# Patient Record
Sex: Male | Born: 1985
Health system: Southern US, Community
[De-identification: ages and names within clinical notes are randomized; demographics above are authoritative.]

## PROBLEM LIST (undated history)

## (undated) DIAGNOSIS — M950 Acquired deformity of nose: Secondary | ICD-10-CM

## (undated) DIAGNOSIS — J342 Deviated nasal septum: Secondary | ICD-10-CM

## (undated) HISTORY — PX: APPENDECTOMY: SHX54

---

## 2005-09-01 HISTORY — PX: NASAL FRACTURE SURGERY: SHX718

## 2007-09-02 ENCOUNTER — Emergency Department (HOSPITAL_COMMUNITY): Admission: EM | Admit: 2007-09-02 | Discharge: 2007-09-02 | Payer: Self-pay | Admitting: Emergency Medicine

## 2007-09-10 ENCOUNTER — Emergency Department (HOSPITAL_COMMUNITY): Admission: EM | Admit: 2007-09-10 | Discharge: 2007-09-10 | Payer: Self-pay | Admitting: Emergency Medicine

## 2008-04-27 ENCOUNTER — Ambulatory Visit (HOSPITAL_COMMUNITY): Admission: RE | Admit: 2008-04-27 | Discharge: 2008-04-27 | Payer: Self-pay | Admitting: Internal Medicine

## 2008-07-10 ENCOUNTER — Ambulatory Visit (HOSPITAL_COMMUNITY): Admission: RE | Admit: 2008-07-10 | Discharge: 2008-07-10 | Payer: Self-pay | Admitting: Family Medicine

## 2008-09-24 ENCOUNTER — Emergency Department (HOSPITAL_COMMUNITY): Admission: EM | Admit: 2008-09-24 | Discharge: 2008-09-24 | Payer: Self-pay | Admitting: Emergency Medicine

## 2009-05-20 ENCOUNTER — Emergency Department (HOSPITAL_COMMUNITY): Admission: EM | Admit: 2009-05-20 | Discharge: 2009-05-21 | Payer: Self-pay | Admitting: Emergency Medicine

## 2009-10-11 ENCOUNTER — Emergency Department (HOSPITAL_COMMUNITY): Admission: EM | Admit: 2009-10-11 | Discharge: 2009-10-11 | Payer: Self-pay | Admitting: Emergency Medicine

## 2011-09-22 ENCOUNTER — Emergency Department (HOSPITAL_COMMUNITY): Payer: No Typology Code available for payment source

## 2011-09-22 ENCOUNTER — Encounter (HOSPITAL_COMMUNITY): Payer: Self-pay | Admitting: *Deleted

## 2011-09-22 ENCOUNTER — Emergency Department (HOSPITAL_COMMUNITY)
Admission: EM | Admit: 2011-09-22 | Discharge: 2011-09-22 | Disposition: A | Discharge status: Home / Self Care | Payer: No Typology Code available for payment source | Attending: Emergency Medicine | Admitting: Emergency Medicine

## 2011-09-22 DIAGNOSIS — M545 Low back pain, unspecified: Secondary | ICD-10-CM | POA: Insufficient documentation

## 2011-09-22 DIAGNOSIS — M542 Cervicalgia: Secondary | ICD-10-CM | POA: Insufficient documentation

## 2011-09-22 DIAGNOSIS — R51 Headache: Secondary | ICD-10-CM | POA: Insufficient documentation

## 2011-09-22 DIAGNOSIS — T148XXA Other injury of unspecified body region, initial encounter: Secondary | ICD-10-CM

## 2011-09-22 MED ORDER — HYDROCODONE-ACETAMINOPHEN 5-325 MG PO TABS: ORAL_TABLET | ORAL | Status: AC

## 2011-09-22 MED ORDER — IBUPROFEN 400 MG PO TABS
400.0000 mg | ORAL_TABLET | Freq: Once | ORAL | Status: AC
  Administered 2011-09-22: 400 mg via ORAL
  Filled 2011-09-22: qty 1

## 2011-09-22 MED ORDER — METHOCARBAMOL 500 MG PO TABS: 1000.0000 mg | ORAL_TABLET | Freq: Four times a day (QID) | ORAL | Status: AC | PRN

## 2011-09-22 MED ORDER — OXYCODONE-ACETAMINOPHEN 5-325 MG PO TABS
1.0000 | ORAL_TABLET | Freq: Once | ORAL | Status: AC
  Administered 2011-09-22: 1 via ORAL
  Filled 2011-09-22: qty 1

## 2011-09-22 MED ORDER — NAPROXEN 250 MG PO TABS: 250.0000 mg | ORAL_TABLET | Freq: Two times a day (BID) | ORAL | Status: DC
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
## 2011-09-22 NOTE — ED Notes (Signed)
Patient returned to CT via stretcher.
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
## 2011-11-18 ENCOUNTER — Encounter (HOSPITAL_COMMUNITY): Payer: Self-pay

## 2011-11-18 ENCOUNTER — Emergency Department (HOSPITAL_COMMUNITY)
Admission: EM | Admit: 2011-11-18 | Discharge: 2011-11-18 | Disposition: A | Discharge status: Home / Self Care | Payer: Self-pay | Attending: Emergency Medicine | Admitting: Emergency Medicine

## 2011-11-18 DIAGNOSIS — J029 Acute pharyngitis, unspecified: Secondary | ICD-10-CM | POA: Insufficient documentation

## 2011-11-18 DIAGNOSIS — B349 Viral infection, unspecified: Secondary | ICD-10-CM

## 2011-11-18 DIAGNOSIS — B9789 Other viral agents as the cause of diseases classified elsewhere: Secondary | ICD-10-CM | POA: Insufficient documentation

## 2011-11-18 LAB — RAPID STREP SCREEN (MED CTR MEBANE ONLY): Streptococcus, Group A Screen (Direct): NEGATIVE

## 2011-11-18 MED ORDER — IBUPROFEN 800 MG PO TABS
800.0000 mg | ORAL_TABLET | Freq: Once | ORAL | Status: AC
  Administered 2011-11-18: 800 mg via ORAL
  Filled 2011-11-18: qty 1

## 2011-11-18 MED ORDER — HYDROCODONE-ACETAMINOPHEN 5-325 MG PO TABS: 1.0000 | ORAL_TABLET | ORAL | Status: DC | PRN

## 2011-11-18 MED ORDER — HYDROCODONE-ACETAMINOPHEN 5-325 MG PO TABS
1.0000 | ORAL_TABLET | Freq: Once | ORAL | Status: AC
  Administered 2011-11-18: 1 via ORAL
  Filled 2011-11-18: qty 1

## 2011-11-18 MED ORDER — IBUPROFEN 800 MG PO TABS: 800.0000 mg | ORAL_TABLET | Freq: Three times a day (TID) | ORAL | Status: DC

## 2011-11-18 NOTE — ED Notes (Signed)
Pt alert & oriented x4, stable gait. Pt given discharge instructions, paperwork & prescription(s). Patient instructed to stop at the registration desk to finish any additional paperwork. pt verbalized understanding. Pt left department w/ no further questions.  

## 2011-11-18 NOTE — ED Notes (Signed)
Pt states took tylenol about 1 hour ago for fever.

## 2011-11-18 NOTE — ED Notes (Signed)
Sore throat and fever since saturday

## 2011-11-18 NOTE — Discharge Instructions (Signed)
Drink lots of fluids. Use tylenol or ibuprofen for aches, pains and any fevers. Salt water gargles, lozenges, hot beverages for the sore throat.Use the stronger pain medicine as needed. Follow up with your doctor.

## 2011-11-18 NOTE — ED Provider Notes (Signed)
History     CSN: [id number]  Arrival date & time 11/18/11  0343   First MD Initiated Contact with Patient 11/18/11 [phone]      Chief Complaint  Patient presents with  . Sore Throat  . Fever    (Consider location/radiation/quality/duration/timing/severity/associated sxs/prior treatment) HPI Daniel Nunez is a 26 y.o. male who presents to the Emergency Department complaining of sore throat, body aches, chills, fever since Saturday. He has taken tylenol with no relief. Denies nausea, vomiting, chest pain, shortness of breath. He has had a dry cough.  History reviewed. No pertinent past medical history.  History reviewed. No pertinent past surgical history.  No family history on file.  History  Substance Use Topics  . Smoking status: Never Smoker   . Smokeless tobacco: Not on file  . Alcohol Use: Yes      Review of Systems  Constitutional: Positive for fever, chills and fatigue.       10 Systems reviewed and are negative for acute change except as noted in the HPI.  HENT: Negative.  Negative for congestion.        Sore throat  Eyes: Negative for discharge and redness.  Respiratory: Positive for cough. Negative for shortness of breath.   Cardiovascular: Negative for chest pain.  Gastrointestinal: Negative for vomiting and abdominal pain.  Musculoskeletal: Negative for back pain.       Body aches and pain  Skin: Negative for rash.  Neurological: Negative for syncope, numbness and headaches.  Psychiatric/Behavioral:       No behavior change.    Allergies  Penicillins  Home Medications   Current Outpatient Rx  Name Route Sig Dispense Refill  . ADULT MULTIVITAMIN W/MINERALS CH Oral Take 1 tablet by mouth daily.    Marland Kitchen NAPROXEN 250 MG PO TABS Oral Take 1 tablet (250 mg total) by mouth 2 (two) times daily with a meal. 14 tablet 0    BP 117/75  Pulse 104  Temp(Src) 100.7 F (38.2 C) (Oral)  Resp 16  Ht 6' (1.829 m)  Wt 210 lb (95.255 kg)  BMI 28.48 kg/m2  SpO2  100%  Physical Exam  Nursing note and vitals reviewed. Constitutional:       Awake, alert, nontoxic appearance.  HENT:  Head: Atraumatic.  Eyes: Right eye exhibits no discharge. Left eye exhibits no discharge.  Neck: Neck supple.  Pulmonary/Chest: Effort normal. He exhibits no tenderness.  Abdominal: Soft. There is no tenderness. There is no rebound.  Musculoskeletal: He exhibits no tenderness.       Baseline ROM, no obvious new focal weakness.  Neurological:       Mental status and motor strength appears baseline for patient and situation.  Skin: No rash noted.  Psychiatric: He has a normal mood and affect.    ED Course  Procedures (including critical care time) Results for orders placed during the hospital encounter of 11/18/11  RAPID STREP SCREEN      Component Value Range   Streptococcus, Group A Screen (Direct) NEGATIVE  NEGATIVE       MDM  Patient with flu like symptoms since Saturday. Given ibuprofen and analgesic with improvement. Pt feels improved after observation and/or treatment in ED.Pt stable in ED with no significant deterioration in condition.The patient appears reasonably screened and/or stabilized for discharge and I doubt any other medical condition or other [facility] requiring further screening, evaluation, or treatment in the ED at this time prior to discharge.  MDM Reviewed: nursing note and vitals  Interpretation: labs           Nicoletta Dress. Colon Branch, MD 11/18/11 1610

## 2011-11-23 ENCOUNTER — Encounter (HOSPITAL_COMMUNITY): Payer: Self-pay | Admitting: *Deleted

## 2011-11-23 ENCOUNTER — Emergency Department (HOSPITAL_COMMUNITY)
Admission: EM | Admit: 2011-11-23 | Discharge: 2011-11-23 | Disposition: A | Discharge status: Home / Self Care | Payer: Self-pay | Attending: Emergency Medicine | Admitting: Emergency Medicine

## 2011-11-23 DIAGNOSIS — K121 Other forms of stomatitis: Secondary | ICD-10-CM

## 2011-11-23 DIAGNOSIS — K1370 Unspecified lesions of oral mucosa: Secondary | ICD-10-CM | POA: Insufficient documentation

## 2011-11-23 DIAGNOSIS — R599 Enlarged lymph nodes, unspecified: Secondary | ICD-10-CM | POA: Insufficient documentation

## 2011-11-23 DIAGNOSIS — M542 Cervicalgia: Secondary | ICD-10-CM | POA: Insufficient documentation

## 2011-11-23 LAB — GLUCOSE, CAPILLARY: Glucose-Capillary: 88 mg/dL (ref 70–99)

## 2011-11-23 LAB — RAPID STREP SCREEN (MED CTR MEBANE ONLY): Streptococcus, Group A Screen (Direct): NEGATIVE

## 2011-11-23 MED ORDER — LIDOCAINE VISCOUS 2 % MT SOLN
20.0000 mL | Freq: Once | OROMUCOSAL | Status: AC
  Administered 2011-11-23: 15 mL via OROMUCOSAL
  Filled 2011-11-23: qty 15

## 2011-11-23 NOTE — ED Notes (Signed)
Reports sore throat x 1 week, went to dr this week but not given any antibiotics, still having sore throat and reports right side swelling to neck. Airway intact, resp e/u.

## 2011-11-23 NOTE — Discharge Instructions (Signed)
Oral Ulcers Oral ulcers are painful, shallow sores around the lining of the mouth. They can affect the gums, the inside of the lips and the cheeks (sores on the outside of the lips and on the face are different). They typically first occur in school aged children and teenagers. Oral ulcers may also be called canker sores or cold sores. CAUSES  Canker sores and cold sores can be caused by many factors including:  Infection.   Injury.   Sun exposure.   Medications.   Emotional stress.   Food allergies.   Vitamin deficiencies.   Toothpastes containing sodium lauryl sulfate.  The Herpes Virus can be the cause of mouth ulcers. The first infection can be severe and cause 10 or more ulcers on the gums, tongue and lips with fever and difficulty in swallowing. This infection usually occurs between the ages of 1 and 3 years.  SYMPTOMS  The typical sore is about  inch (6 mm) in size, is an oval or round ulcer with red borders. DIAGNOSIS  Your caregiver can diagnose simple oral ulcers by examination. Additional testing is usually not required.  TREATMENT  Treatment is aimed at pain relief. Generally, oral ulcers resolve by themselves within 1 to 2 weeks without medication and are not contagious unless caused by Herpes (and other viruses). Antibiotics are not effective with mouth sores. Avoid direct contact with others until the ulcer is completely healed. See your caregiver for follow-up care as recommended. Also:  Offer a soft diet.   Encourage plenty of fluids to prevent dehydration. Popsicles and milk shakes can be helpful.   Avoid acidic and salty foods and drinks such as orange juice.   Infants and young children will often refuse to drink because of pain. Using a teaspoon, cup or syringe to give small amounts of fluids frequently can help prevent dehydration.   Cold compresses on the face may help reduce pain.   Pain medication can help control soreness.   A solution of  diphenhydramine (children's liquid benadryl, 25mg )  mixed with a liquid antacid (maalox, half of a normal dose) can be useful to decrease the soreness of ulcers. Consult a caregiver for the dosing.   Liquids or ointments with a numbing ingredient may be helpful when used as recommended.   Older children and teenagers can rinse their mouth with a salt-water mixture (1/2 teaspoonof salt in 8 ounces of water) four times a day. This treatment is uncomfortable but may reduce the time the ulcers are present.   There are many over the counter throat lozenges and medications available for oral ulcers. There effectiveness has not been studied.   Consult your medical caregiver prior to using homeopathic treatments for oral ulcers.  SEEK MEDICAL CARE IF:   You think your child needs to be seen.   The pain worsens and you cannot control it.   There are 4 or more ulcers.   The lips and gums begin to bleed and crust.   A single mouth ulcer is near a tooth that is causing a toothache or pain.   Your child has a fever, swollen face, or swollen glands.   The ulcers began after starting a medication.   Mouth ulcers keep re-occurring or last more than 2 weeks.   You think your child is not taking adequate fluids.  SEEK IMMEDIATE MEDICAL CARE IF:   You have a high fever.   You are unable to swallow or become dehydrated.   You look or acts  very ill.   An ulcer caused by a chemical your child accidentally put in their mouth.  Document Released: 09/25/2004 Document Revised: 08/07/2011 Document Reviewed: 05/10/2009 [facility] Patient Information 2012 [employer], [place].       Antibiotic Nonuse  Your caregiver felt that the infection or problem was not one that would be helped with an antibiotic. Infections may be caused by viruses or bacteria. Only a caregiver can tell which one of these is the likely cause of an illness. A cold is the most common cause of infection in both adults and children. A  cold is a virus. Antibiotic treatment will have no effect on a viral infection. Viruses can lead to many lost days of work caring for sick children and many missed days of school. Children may catch as many as 10 "colds" or "flus" per year during which they can be tearful, cranky, and uncomfortable. The goal of treating a virus is aimed at keeping the ill person comfortable. Antibiotics are medications used to help the body fight bacterial infections. There are relatively few types of bacteria that cause infections but there are hundreds of viruses. While both viruses and bacteria cause infection they are very different types of germs. A viral infection will typically go away by itself within 7 to 10 days. Bacterial infections may spread or get worse without antibiotic treatment. Examples of bacterial infections are:  Sore throats (like strep throat or tonsillitis).   Infection in the lung (pneumonia).   Ear and skin infections.  Examples of viral infections are:  Colds or flus.   Most coughs and bronchitis.   Sore throats not caused by Strep.   Runny noses.  It is often best not to take an antibiotic when a viral infection is the cause of the problem. Antibiotics can kill off the helpful bacteria that we have inside our body and allow harmful bacteria to start growing. Antibiotics can cause side effects such as allergies, nausea, and diarrhea without helping to improve the symptoms of the viral infection. Additionally, repeated uses of antibiotics can cause bacteria inside of our body to become resistant. That resistance can be passed onto harmful bacterial. The next time you have an infection it may be harder to treat if antibiotics are used when they are not needed. Not treating with antibiotics allows our own immune system to develop and take care of infections more efficiently. Also, antibiotics will work better for [place] when they are prescribed for bacterial infections. Treatments for a child  that is ill may include:  Give extra fluids throughout the day to stay hydrated.   Get plenty of rest.   Only give your child over-the-counter or prescription medicines for pain, discomfort, or fever as directed by your caregiver.   The use of a cool mist humidifier may help stuffy noses.   Cold medications if suggested by your caregiver.  Your caregiver may decide to start you on an antibiotic if:  The problem you were seen for today continues for a longer length of time than expected.   You develop a secondary bacterial infection.  SEEK MEDICAL CARE IF:  Fever lasts longer than 5 days.   Symptoms continue to get worse after 5 to 7 days or become severe.   Difficulty in breathing develops.   Signs of dehydration develop (poor drinking, rare urinating, dark colored urine).   Changes in behavior or worsening tiredness (listlessness or lethargy).  Document Released: 10/27/2001 Document Revised: 08/07/2011 Document Reviewed: 04/25/2009 [employer] Patient Information  [address] [place].

## 2011-11-23 NOTE — ED Provider Notes (Signed)
History     CSN: [id number]  Arrival date & time 11/23/11  1235   First MD Initiated Contact with Patient 11/23/11 1255      Chief Complaint  Patient presents with  . Sore Throat    (Consider location/radiation/quality/duration/timing/severity/associated sxs/prior treatment) The history is provided by the patient.  Pt is a healthy 26 y/o M who presents to the ED via private vehicle with c/c of sore throat x 1 week. Although pain initially in the entire throat, it now involves only the right side, is mild, and is associated with swelling of the right side of the neck. When the sore throat began, he had associated viral symptoms (fever, chills, congestion, myalgias), though these resolved several days ago. He was seen in the [facility] ED for his symptoms 5 days ago and had a negative strep screen with rx for ibuprofen and hydrocodone-acetaminophen. Swallowing makes his pain worse. Pain medications help temporarily.   History reviewed. No pertinent past medical history.  History reviewed. No pertinent past surgical history.  History reviewed. No pertinent family history.  History  Substance Use Topics  . Smoking status: Never Smoker   . Smokeless tobacco: Not on file  . Alcohol Use: Yes      Review of Systems  Constitutional: Negative for fever and chills.  HENT: Positive for sore throat and neck pain. Negative for ear pain, congestion, facial swelling and neck stiffness.   Eyes: Negative for pain.  Respiratory: Negative for cough and shortness of breath.   Gastrointestinal: Negative for nausea and vomiting.  Skin: Negative for rash.  Neurological: Negative for headaches.    Allergies  Penicillins  Home Medications   Current Outpatient Rx  Name Route Sig Dispense Refill  . ACETAMINOPHEN 500 MG PO TABS Oral Take 1,000 mg by mouth every 6 (six) hours as needed. For pain.    Marland Kitchen HYDROCODONE-ACETAMINOPHEN 5-325 MG PO TABS Oral Take 1 tablet by mouth every 4 (four) hours as  needed. For pain.    . IBUPROFEN 800 MG PO TABS Oral Take 800 mg by mouth 3 (three) times daily as needed. For pain.      BP 141/77  Pulse 93  Temp(Src) 98.9 F (37.2 C) (Oral)  Resp 18  SpO2 100%  Physical Exam  Nursing note and vitals reviewed. Constitutional: He is oriented to person, place, and time. He appears well-developed and well-nourished. No distress.  HENT:  Head: Normocephalic and atraumatic. No trismus in the jaw.  Right Ear: Hearing, tympanic membrane, external ear and ear canal normal.  Left Ear: Hearing, tympanic membrane, external ear and ear canal normal.  Nose: Nose normal.  Mouth/Throat: Mucous membranes are normal. Oral lesions present. No uvula swelling. Posterior oropharyngeal erythema present. No oropharyngeal exudate, posterior oropharyngeal edema or tonsillar abscesses.    Neck: Normal range of motion. Neck supple. No tracheal deviation present.       Right tonsillar LAD  Cardiovascular: Normal rate, regular rhythm and normal heart sounds.   Pulmonary/Chest: Effort normal and breath sounds normal. No stridor. No respiratory distress. He has no wheezes. He exhibits no tenderness.  Musculoskeletal: He exhibits no edema and no tenderness.  Neurological: He is alert and oriented to person, place, and time. No cranial nerve deficit.  Skin: Skin is warm and dry. No rash noted.  Psychiatric: He has a normal mood and affect.    ED Course  Procedures (including critical care time)   Labs Reviewed  RAPID STREP SCREEN  GLUCOSE, CAPILLARY  No results found.   1. Oral ulceration       MDM  Healthy male, non-toxic appearing with 1 week symptoms. On today's examination two ulcers are visualized in the oral cavity, most consistent with apthous ulcers vs herpangina. No indication on prior visit chart of ulcers on exam. Discussed symptomatic treatment measures with patient and family. Will give oral viscous lidocaine for comfort.      2:04 PM Pt with  some pain relief with oral lidocaine, but refuses a prescription for home use as he feels his discomfort is only mild.  Shaaron Adler, [place] 11/23/11 [phone]

## 2011-11-25 NOTE — ED Provider Notes (Signed)
Medical screening examination/treatment/procedure(s) were performed by non-physician practitioner and as supervising physician I was immediately available for consultation/collaboration.   Cyndra Numbers, MD 11/25/11 [phone]

## 2012-08-19 ENCOUNTER — Emergency Department (HOSPITAL_COMMUNITY)
Admission: EM | Admit: 2012-08-19 | Discharge: 2012-08-19 | Disposition: A | Discharge status: Home / Self Care | Payer: Self-pay | Attending: Emergency Medicine | Admitting: Emergency Medicine

## 2012-08-19 ENCOUNTER — Emergency Department (HOSPITAL_COMMUNITY): Payer: Self-pay

## 2012-08-19 ENCOUNTER — Encounter (HOSPITAL_COMMUNITY): Payer: Self-pay | Admitting: *Deleted

## 2012-08-19 DIAGNOSIS — R059 Cough, unspecified: Secondary | ICD-10-CM | POA: Insufficient documentation

## 2012-08-19 DIAGNOSIS — R0602 Shortness of breath: Secondary | ICD-10-CM | POA: Insufficient documentation

## 2012-08-19 DIAGNOSIS — B349 Viral infection, unspecified: Secondary | ICD-10-CM

## 2012-08-19 DIAGNOSIS — J40 Bronchitis, not specified as acute or chronic: Secondary | ICD-10-CM | POA: Insufficient documentation

## 2012-08-19 DIAGNOSIS — IMO0001 Reserved for inherently not codable concepts without codable children: Secondary | ICD-10-CM | POA: Insufficient documentation

## 2012-08-19 DIAGNOSIS — J029 Acute pharyngitis, unspecified: Secondary | ICD-10-CM | POA: Insufficient documentation

## 2012-08-19 DIAGNOSIS — B9789 Other viral agents as the cause of diseases classified elsewhere: Secondary | ICD-10-CM | POA: Insufficient documentation

## 2012-08-19 DIAGNOSIS — R05 Cough: Secondary | ICD-10-CM | POA: Insufficient documentation

## 2012-08-19 MED ORDER — AZITHROMYCIN 250 MG PO TABS: 250.0000 mg | ORAL_TABLET | Freq: Every day | ORAL | Status: DC

## 2012-08-19 MED ORDER — PROMETHAZINE-CODEINE 6.25-10 MG/5ML PO SYRP: 5.0000 mL | ORAL_SOLUTION | ORAL | Status: DC | PRN

## 2012-08-19 MED ORDER — ACETAMINOPHEN 500 MG PO TABS
ORAL_TABLET | ORAL | Status: AC
  Administered 2012-08-19: 1000 mg
  Filled 2012-08-19: qty 2

## 2012-08-19 MED ORDER — AZITHROMYCIN 250 MG PO TABS
500.0000 mg | ORAL_TABLET | Freq: Once | ORAL | Status: AC
  Administered 2012-08-19: 500 mg via ORAL
  Filled 2012-08-19: qty 2

## 2012-08-19 NOTE — ED Notes (Signed)
Fever, cough, headache, chest hurts with cough.  Yellow-clear sputum.

## 2012-08-19 NOTE — ED Provider Notes (Signed)
History     CSN: [id number]  Arrival date & time 08/19/12  4098   First MD Initiated Contact with Patient 08/19/12 1852      Chief Complaint  Patient presents with  . Fever    (Consider location/radiation/quality/duration/timing/severity/associated sxs/prior treatment) HPI Comments: Daniel Nunez presents with a return of fever since last night up to 102.  He describes developing fever, chills, muscle aches,  non productive cough and sore throat starting 4 days ago.  He has slept a lot and has been taking dayquil and nyquil for his symptoms and was actually feeling better until last night when his fever spiked again and has been having a more productive cough with yellow to green sputum along with upper sternal burning pain with cough.  He has felt short of breath.  He denies nausea, vomiting and abdominal pain.  The history is provided by the patient.    History reviewed. No pertinent past medical history.  Past Surgical History  Procedure Date  . Fx nose surgery     History reviewed. No pertinent family history.  History  Substance Use Topics  . Smoking status: Never Smoker   . Smokeless tobacco: Not on file  . Alcohol Use: Yes      Review of Systems  Constitutional: Positive for fever and chills.  HENT: Negative for congestion, sore throat, rhinorrhea and neck pain.   Eyes: Negative.   Respiratory: Positive for cough and shortness of breath. Negative for chest tightness and wheezing.   Cardiovascular: Negative for chest pain.  Gastrointestinal: Negative for nausea and abdominal pain.  Genitourinary: Negative.   Musculoskeletal: Positive for myalgias. Negative for joint swelling and arthralgias.  Skin: Negative.  Negative for rash and wound.  Neurological: Negative for dizziness, weakness, light-headedness, numbness and headaches.  Hematological: Negative.   Psychiatric/Behavioral: Negative.     Allergies  Penicillins  Home Medications   Current  Outpatient Rx  Name  Route  Sig  Dispense  Refill  . ACETAMINOPHEN 500 MG PO TABS   Oral   Take 1,000 mg by mouth every 6 (six) hours as needed. For pain.         Marland Kitchen VICKS DAYQUIL/NYQUIL CLD & FLU PO   Oral   Take 2 capsules by mouth daily as needed. For cold and flu symptoms         . AZITHROMYCIN 250 MG PO TABS   Oral   Take 1 tablet (250 mg total) by mouth daily.   4 tablet   0   . PROMETHAZINE-CODEINE 6.25-10 MG/5ML PO SYRP   Oral   Take 5 mLs by mouth every 4 (four) hours as needed for cough.   120 mL   0     BP 138/80  Pulse 92  Temp 101.3 F (38.5 C) (Oral)  Resp 20  Ht 6' (1.829 m)  Wt 204 lb (92.534 kg)  BMI 27.67 kg/m2  SpO2 100%  Physical Exam  Nursing note and vitals reviewed. Constitutional: He appears well-developed and well-nourished.  HENT:  Head: Normocephalic and atraumatic.  Eyes: Conjunctivae normal are normal.  Neck: Normal range of motion.  Cardiovascular: Normal rate, regular rhythm, normal heart sounds and intact distal pulses.   Pulmonary/Chest: Effort normal. He has no wheezes. He has rhonchi in the right lower field. He exhibits no tenderness.       Occasional crackle right base with decreased breath sounds.  Abdominal: Soft. Bowel sounds are normal. There is no tenderness.  Musculoskeletal: Normal range  of motion.  Neurological: He is alert.  Skin: Skin is warm and dry.  Psychiatric: He has a normal mood and affect.    ED Course  Procedures (including critical care time)  Labs Reviewed - No data to display Dg Chest 2 View  08/19/2012  *RADIOLOGY REPORT*  Clinical Data: Fever and cough  CHEST - 2 VIEW  Comparison: Chest radiograph 09/22/2011  Findings: The heart, mediastinal, and hilar contours are normal. The lungs are normally expanded and clear.  The trachea is midline. Negative for pleural effusion or pneumothorax.  The bones are unremarkable.  IMPRESSION: No acute cardiopulmonary disease.   Original Report Authenticated By:  Britta Mccreedy, M.D.      1. Viral syndrome   2. Bronchitis       MDM  X-ray reviewed and discussed with patient.  However, given patient's symptoms and exam findings, and suspicious for possible early infiltrate that just is not showing yet on plain film.  He was prescribed Zithromax, first dose given here.  Also encouraged rest, increase fluids, prescribed Phenergan With Codeine for cough, encouraged Motrin for fever reduction.  Patient will get rechecked for any worsened symptoms such as persistent or increasing fevers, shortness of breath or increased weakness.        Burgess Amor, [place] 08/19/12 2114

## 2012-08-19 NOTE — ED Provider Notes (Signed)
Medical screening examination/treatment/procedure(s) were performed by non-physician practitioner and as supervising physician I was immediately available for consultation/collaboration.  Jones Skene, M.D.     Jones Skene, MD 08/19/12 2236

## 2013-01-30 DIAGNOSIS — J342 Deviated nasal septum: Secondary | ICD-10-CM

## 2013-01-30 DIAGNOSIS — M950 Acquired deformity of nose: Secondary | ICD-10-CM

## 2013-01-30 HISTORY — DX: Acquired deformity of nose: M95.0

## 2013-01-30 HISTORY — DX: Deviated nasal septum: J34.2

## 2013-02-01 ENCOUNTER — Encounter (HOSPITAL_BASED_OUTPATIENT_CLINIC_OR_DEPARTMENT_OTHER): Payer: Self-pay | Admitting: *Deleted

## 2013-02-07 ENCOUNTER — Encounter (HOSPITAL_BASED_OUTPATIENT_CLINIC_OR_DEPARTMENT_OTHER): Payer: Self-pay | Admitting: Anesthesiology

## 2013-02-07 ENCOUNTER — Encounter (HOSPITAL_BASED_OUTPATIENT_CLINIC_OR_DEPARTMENT_OTHER)
Admission: RE | Disposition: A | Discharge status: Home / Self Care | Payer: Self-pay | Source: Ambulatory Visit | Attending: Specialist

## 2013-02-07 ENCOUNTER — Ambulatory Visit (HOSPITAL_BASED_OUTPATIENT_CLINIC_OR_DEPARTMENT_OTHER)
Admission: RE | Admit: 2013-02-07 | Discharge: 2013-02-07 | Disposition: A | Discharge status: Home / Self Care | Payer: BC Managed Care – PPO | Source: Ambulatory Visit | Attending: Specialist | Admitting: Specialist

## 2013-02-07 ENCOUNTER — Ambulatory Visit (HOSPITAL_BASED_OUTPATIENT_CLINIC_OR_DEPARTMENT_OTHER): Payer: BC Managed Care – PPO | Admitting: Anesthesiology

## 2013-02-07 DIAGNOSIS — X58XXXS Exposure to other specified factors, sequela: Secondary | ICD-10-CM | POA: Insufficient documentation

## 2013-02-07 DIAGNOSIS — J342 Deviated nasal septum: Secondary | ICD-10-CM | POA: Insufficient documentation

## 2013-02-07 DIAGNOSIS — Z9889 Other specified postprocedural states: Secondary | ICD-10-CM | POA: Insufficient documentation

## 2013-02-07 DIAGNOSIS — S0291XS Unspecified fracture of skull, sequela: Secondary | ICD-10-CM | POA: Insufficient documentation

## 2013-02-07 DIAGNOSIS — M950 Acquired deformity of nose: Secondary | ICD-10-CM | POA: Insufficient documentation

## 2013-02-07 DIAGNOSIS — J343 Hypertrophy of nasal turbinates: Secondary | ICD-10-CM | POA: Insufficient documentation

## 2013-02-07 DIAGNOSIS — Z880 Allergy status to penicillin: Secondary | ICD-10-CM | POA: Insufficient documentation

## 2013-02-07 HISTORY — DX: Acquired deformity of nose: M95.0

## 2013-02-07 HISTORY — PX: NASAL RECONSTRUCTION: SHX2069

## 2013-02-07 HISTORY — PX: NASAL SEPTUM SURGERY: SHX37

## 2013-02-07 HISTORY — DX: Deviated nasal septum: J34.2

## 2013-02-07 SURGERY — RECONSTRUCTION, NOSE
Anesthesia: General | Site: Nose | Wound class: Clean Contaminated

## 2013-02-07 MED ORDER — ONDANSETRON HCL 4 MG/2ML IJ SOLN: 4.0000 mg | Freq: Once | INTRAMUSCULAR | Status: DC | PRN

## 2013-02-07 MED ORDER — LIDOCAINE-EPINEPHRINE 0.5 %-1:200000 IJ SOLN
INTRAMUSCULAR | Status: DC | PRN
  Administered 2013-02-07: 30 mL

## 2013-02-07 MED ORDER — PROPOFOL 10 MG/ML IV BOLUS
INTRAVENOUS | Status: DC | PRN
  Administered 2013-02-07: 300 mg via INTRAVENOUS

## 2013-02-07 MED ORDER — OXYCODONE HCL 5 MG/5ML PO SOLN: 5.0000 mg | Freq: Once | ORAL | Status: AC | PRN

## 2013-02-07 MED ORDER — BSS IO SOLN
INTRAOCULAR | Status: DC | PRN
  Administered 2013-02-07: 1 via INTRAOCULAR

## 2013-02-07 MED ORDER — FENTANYL CITRATE 0.05 MG/ML IJ SOLN
INTRAMUSCULAR | Status: DC | PRN
  Administered 2013-02-07: 100 ug via INTRAVENOUS

## 2013-02-07 MED ORDER — MIDAZOLAM HCL 2 MG/2ML IJ SOLN: 1.0000 mg | INTRAMUSCULAR | Status: DC | PRN

## 2013-02-07 MED ORDER — SUCCINYLCHOLINE CHLORIDE 20 MG/ML IJ SOLN
INTRAMUSCULAR | Status: DC | PRN
  Administered 2013-02-07: 100 mg via INTRAVENOUS

## 2013-02-07 MED ORDER — OXYCODONE HCL 5 MG PO TABS
5.0000 mg | ORAL_TABLET | Freq: Once | ORAL | Status: AC | PRN
  Administered 2013-02-07: 5 mg via ORAL

## 2013-02-07 MED ORDER — BSS PLUS IO SOLN: INTRAOCULAR | Status: DC | PRN

## 2013-02-07 MED ORDER — DEXAMETHASONE SODIUM PHOSPHATE 4 MG/ML IJ SOLN
INTRAMUSCULAR | Status: DC | PRN
  Administered 2013-02-07: 10 mg via INTRAVENOUS

## 2013-02-07 MED ORDER — COCAINE HCL 4 % EX SOLN
CUTANEOUS | Status: DC | PRN
  Administered 2013-02-07: 4 mL via NASAL

## 2013-02-07 MED ORDER — MIDAZOLAM HCL 5 MG/5ML IJ SOLN
INTRAMUSCULAR | Status: DC | PRN
  Administered 2013-02-07: 2 mg via INTRAVENOUS

## 2013-02-07 MED ORDER — CIPROFLOXACIN IN D5W 400 MG/200ML IV SOLN
400.0000 mg | INTRAVENOUS | Status: AC
  Administered 2013-02-07: 400 mg via INTRAVENOUS

## 2013-02-07 MED ORDER — LACTATED RINGERS IV SOLN
INTRAVENOUS | Status: DC
  Administered 2013-02-07 (×2): via INTRAVENOUS

## 2013-02-07 MED ORDER — LIDOCAINE HCL (CARDIAC) 20 MG/ML IV SOLN
INTRAVENOUS | Status: DC | PRN
  Administered 2013-02-07: 80 mg via INTRAVENOUS

## 2013-02-07 MED ORDER — HYDROMORPHONE HCL PF 1 MG/ML IJ SOLN
0.2500 mg | INTRAMUSCULAR | Status: DC | PRN
  Administered 2013-02-07 (×4): 0.5 mg via INTRAVENOUS

## 2013-02-07 MED ORDER — ONDANSETRON HCL 4 MG/2ML IJ SOLN
INTRAMUSCULAR | Status: DC | PRN
  Administered 2013-02-07: 4 mg via INTRAVENOUS

## 2013-02-07 MED ORDER — FENTANYL CITRATE 0.05 MG/ML IJ SOLN: 50.0000 ug | INTRAMUSCULAR | Status: DC | PRN

## 2013-02-07 SURGICAL SUPPLY — 45 items
APPLICATOR COTTON TIP 6IN STRL (MISCELLANEOUS) ×2 IMPLANT
BANDAGE ELASTIC 4 VELCRO ST LF (GAUZE/BANDAGES/DRESSINGS) ×2 IMPLANT
BANDAGE GAUZE ELAST BULKY 4 IN (GAUZE/BANDAGES/DRESSINGS) ×2 IMPLANT
BENZOIN TINCTURE PRP APPL 2/3 (GAUZE/BANDAGES/DRESSINGS) ×2 IMPLANT
BLADE KNIFE PERSONA 15 (BLADE) IMPLANT
CANISTER SUCTION 1200CC (MISCELLANEOUS) ×2 IMPLANT
CLOTH BEACON ORANGE TIMEOUT ST (SAFETY) ×2 IMPLANT
COAGULATOR SUCT 8FR VV (MISCELLANEOUS) ×2 IMPLANT
DECANTER SPIKE VIAL GLASS SM (MISCELLANEOUS) ×2 IMPLANT
ELECT REM PT RETURN 9FT ADLT (ELECTROSURGICAL)
ELECTRODE REM PT RTRN 9FT ADLT (ELECTROSURGICAL) IMPLANT
GAUZE PACKING 1/2 X5 YD (GAUZE/BANDAGES/DRESSINGS) ×2 IMPLANT
GAUZE SPONGE 4X4 16PLY XRAY LF (GAUZE/BANDAGES/DRESSINGS) ×2 IMPLANT
GAUZE VASELINE FOILPK 1/2 X 72 (GAUZE/BANDAGES/DRESSINGS) ×2 IMPLANT
GLOVE BIO SURGEON STRL SZ 6.5 (GLOVE) ×2 IMPLANT
GLOVE BIOGEL M STRL SZ7.5 (GLOVE) ×2 IMPLANT
GLOVE BIOGEL PI IND STRL 8 (GLOVE) ×1 IMPLANT
GLOVE BIOGEL PI INDICATOR 8 (GLOVE) ×1
GLOVE ECLIPSE 7.0 STRL STRAW (GLOVE) ×2 IMPLANT
GOWN PREVENTION PLUS XLARGE (GOWN DISPOSABLE) IMPLANT
GOWN PREVENTION PLUS XXLARGE (GOWN DISPOSABLE) ×2 IMPLANT
MARKER SKIN DUAL TIP RULER LAB (MISCELLANEOUS) ×2 IMPLANT
NEEDLE HYPO 25X1 1.5 SAFETY (NEEDLE) ×2 IMPLANT
PACK BASIN DAY SURGERY FS (CUSTOM PROCEDURE TRAY) ×2 IMPLANT
PACK ENT DAY SURGERY (CUSTOM PROCEDURE TRAY) ×2 IMPLANT
PAD EYE OVAL STERILE LF (GAUZE/BANDAGES/DRESSINGS) ×2 IMPLANT
PATTIES SURGICAL .5 X3 (DISPOSABLE) ×2 IMPLANT
SLEEVE SCD COMPRESS KNEE MED (MISCELLANEOUS) ×2 IMPLANT
SPLINT ELECTRIC BLUE LARGE (MISCELLANEOUS) ×2 IMPLANT
SPLINT ELECTRIC BLUE SMALL (MISCELLANEOUS) IMPLANT
SPLINT HOT PINK LARGE (MISCELLANEOUS) IMPLANT
SPLINT HOT PINK SMALL (MISCELLANEOUS) IMPLANT
SPLINT IVORY SMALL (MISCELLANEOUS) IMPLANT
SPLINT NASAL THERMO PLAST (MISCELLANEOUS) IMPLANT
SPLINT THERMO PLAST IVORY LRG (MISCELLANEOUS) IMPLANT
SPONGE GAUZE 4X4 12PLY (GAUZE/BANDAGES/DRESSINGS) ×2 IMPLANT
SPONGE LAP 4X18 X RAY DECT (DISPOSABLE) ×2 IMPLANT
SPONGE NEURO XRAY DETECT 1X3 (DISPOSABLE) ×2 IMPLANT
STRIP SUTURE WOUND CLOSURE 1/2 (SUTURE) ×4 IMPLANT
SUT MNCRL AB 3-0 PS2 18 (SUTURE) ×10 IMPLANT
SUT MON AB 2-0 CT1 36 (SUTURE) ×2 IMPLANT
SUT PROLENE 3 0 PS 2 (SUTURE) ×2 IMPLANT
SUT PROLENE 6 0 P 1 18 (SUTURE) IMPLANT
TOWEL OR 17X24 6PK STRL BLUE (TOWEL DISPOSABLE) ×4 IMPLANT
YANKAUER SUCT BULB TIP NO VENT (SUCTIONS) ×2 IMPLANT

## 2013-02-07 NOTE — Brief Op Note (Signed)
02/07/2013  12:42 PM  PATIENT:  Margaree Mackintosh  27 y.o. male  PRE-OPERATIVE DIAGNOSIS:  deformity of nose deviated nasal septum   POST-OPERATIVE DIAGNOSIS:  * No post-op diagnosis entered *  PROCEDURE:  Procedure(s): TOTAL NASAL RECONSTRUCTION  (N/A) WITH SEPTOPLASTY (N/A)  SURGEON:  Surgeon(s) and Role:    * Louisa Second, MD - Primary  PHYSICIAN ASSISTANT:   ASSISTANTS: none   ANESTHESIA:   general  EBL:  Total I/O In: 1000 [I.V.:1000] Out: -   BLOOD ADMINISTERED:none  DRAINS: none   LOCAL MEDICATIONS USED:  XYLOCAINE   SPECIMEN:  Excision  DISPOSITION OF SPECIMEN:  PATHOLOGY  COUNTS:  YES  TOURNIQUET:  * No tourniquets in log *  DICTATION: .Other Dictation: Dictation Number  [id number]  PLAN OF CARE: Discharge to home after PACU  PATIENT DISPOSITION:  PACU - hemodynamically stable.   Delay start of Pharmacological VTE agent (>24hrs) due to surgical blood loss or risk of bleeding: yes

## 2013-02-07 NOTE — Anesthesia Postprocedure Evaluation (Signed)
  Anesthesia Post-op Note  Patient: Daniel Nunez  Procedure(s) Performed: Procedure(s): TOTAL NASAL RECONSTRUCTION  (N/A) WITH SEPTOPLASTY (N/A)  Patient Location: PACU  Anesthesia Type:General  Level of Consciousness: awake, alert  and oriented  Airway and Oxygen Therapy: Patient Spontanous Breathing and Patient connected to face mask oxygen  Post-op Pain: mild  Post-op Assessment: Post-op Vital signs reviewed  Post-op Vital Signs: Reviewed  Complications: No apparent anesthesia complications

## 2013-02-07 NOTE — Transfer of Care (Signed)
Immediate Anesthesia Transfer of Care Note  Patient: Daniel Nunez  Procedure(s) Performed: Procedure(s): TOTAL NASAL RECONSTRUCTION  (N/A) WITH SEPTOPLASTY (N/A)  Patient Location: PACU  Anesthesia Type:General  Level of Consciousness: awake, alert , oriented and patient cooperative  Airway & Oxygen Therapy: Patient Spontanous Breathing and Patient connected to face mask oxygen  Post-op Assessment: Report given to PACU RN and Post -op Vital signs reviewed and stable  Post vital signs: Reviewed and stable  Complications: No apparent anesthesia complications

## 2013-02-07 NOTE — Anesthesia Preprocedure Evaluation (Signed)

## 2013-02-07 NOTE — Anesthesia Procedure Notes (Signed)
Procedure Name: Intubation Performed by: Eliah Ozawa W Pre-anesthesia Checklist: Patient identified, Timeout performed, Emergency Drugs available, Suction available and Patient being monitored Patient Re-evaluated:Patient Re-evaluated prior to inductionOxygen Delivery Method: Circle system utilized Preoxygenation: Pre-oxygenation with 100% oxygen Intubation Type: IV induction Ventilation: Mask ventilation without difficulty Laryngoscope Size: Miller and 2 Grade View: Grade II Tube type: Oral Tube size: 7.0 mm Number of attempts: 1 Airway Equipment and Method: Stylet Placement Confirmation: ETT inserted through vocal cords under direct vision,  positive ETCO2 and breath sounds checked- equal and bilateral Secured at: 22 cm Tube secured with: Tape Dental Injury: Teeth and Oropharynx as per pre-operative assessment      
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
## 2013-02-08 ENCOUNTER — Encounter (HOSPITAL_BASED_OUTPATIENT_CLINIC_OR_DEPARTMENT_OTHER): Payer: Self-pay | Admitting: Specialist

## 2013-02-08 LAB — POCT HEMOGLOBIN-HEMACUE: Hemoglobin: 14.7 g/dL (ref 13.0–17.0)

## 2013-02-08 NOTE — Op Note (Deleted)
NAME:  Daniel Nunez, Daniel Nunez              ACCOUNT NO.:  [id number]  MEDICAL RECORD NO.:  [id number]  LOCATION:  [id number]                        FACILITY:  [facility]  PHYSICIAN:  Elvia Aydin L. Angle Karel, M.D.DATE OF BIRTH:  12/03/1985  DATE OF PROCEDURE:  02/07/2013 DATE OF DISCHARGE:  08/19/2012                              OPERATIVE REPORT   This is a 27-year-old gentleman who has completely flatten dorsal nose, saddle nose deformity, secondary to previous trauma in the past.  He had open reduction, internal fixation done several years ago in the [facility], but evidently the areas have not healed up and he had a total collapse of the area with complete obstruction of airway breathing bilaterally as well as hypertrophy of the right and left inferior turbinates.  All the procedures in detail were explained to the patient preoperatively for the reconstruction using a fascia cartilage graft from the right postauricular area and the right groin to reconstruct the areas.  The donor sites are going to be closed primarily and then reduction of the right and left inferior turbinates with septoplasty. Preoperatively, the patient was sat up and drawn for the midline of the nose as well as demarcation of the upper and lower lateral cartilage areas, as well as the totally flatten dorsum of his nose with severe saddle nose deformity.  The patient demonstrates no bony pre- dimensionality involving his nasal bone secondary to the blow.  He was taken to the operating room, placed on the operating room table in the supine position, was given adequate general anesthesia, intubated orally.  Prep was done to the facial, neck, and groin areas with Betadine solution, walled off with sterile towels and drapes so as to make a sterile field.  A 0.5% Xylocaine with epinephrine was injected locally intranasally as well as to right postauricular area and right groin areas, total of 35 mL used 1-200,000 concentration.  This  was allowed to sit, also 4% cocaine packs were placed.  Total of 4 mL in narrowing.  His nasal airway is for vasoconstriction.  We drew a pattern of the deformity of the nose as saddle nose deformity and superimposed it over to the hard piece of sterile paper.  Right postauricular incision was made in the crease line and carried down through the skin and subcutaneous tissue with a 15 blade to underlying cartilage.  Flap was dissected and then I was able to isolate the posterior cartilage of the conchal region of the ear using the curved and direct scissors.  I was able to remove the large piece, placed in a saline.  The defect then re-closed with a running suture of 4-0 Prolene.  Next, a right groin was used to do a large elliptical excision of skin.  The skin was de- epithelialized over the mid dermis fat and fascia in 1 area.  This was taken in 1 block and placed in saline.  After proper hemostasis, the defect was then re-closed with 2-0 Monocryl x2 layers with subdermal suture of 3-0 Monocryl in a running subcuticular stitch of 3-0 Monocryl. Next, intercartilaginous incisions were carried over the dorsal septum. Mucoperichondrium was dissected bilaterally reveal in the form of septum and depressed.  The   deformed curved areas were excised out using the swivel knife and the forceps were used to remove bony prominences. Next, the dorsal angle scissors were used to elevate over the dorsal nose for space.  I was able to take to fascia and __________ and then I took pieces of the cartilage of the ear into speckles __________ approximately 20 pieces and I placed them within the fascia to make a fascia cartilage __________.  I was able to place this and mold it, suture it together using an 3-0 Monocryl, then placed into the dorsal defect to improve this __________ approximately 95%.  With this, I was able to transfix and stitch to hold it on each side and then __________ using the 3-0 nylon.   Next, the mucosa was then re-closed.  Transfixion stitch of 3-0 Monocryl placed.  Mucosa was then closed with 3-0 Monocryl, and this was after we had done the right and left lateral osteotomies to improve the nasal contour and elevate as much as we could.  This fit nicely with good contour on the profile and I was able to then pack the wounds with Vaseline gauze.  I was able to then put dorsal splint dressings on.  He tolerated all the procedures very well.  Proper dressings were placed.  He was taken to recovery in excellent condition.     Sherlonda Flater L. Mikka Kissner, M.D.     GLT/MEDQ  D:  02/07/2013  T:  02/08/2013  Job:  [id number] 

## 2013-02-08 NOTE — Op Note (Cosign Needed)
NAME:  Daniel Nunez, Daniel Nunez              ACCOUNT NO.:  [id number]  MEDICAL RECORD NO.:  [id number]  LOCATION:  [id number]                        FACILITY:  [facility]  PHYSICIAN:  Talley Kreiser L. Panagiota Perfetti, M.D.DATE OF BIRTH:  02/02/1986  DATE OF PROCEDURE:  02/07/2013 DATE OF DISCHARGE:  08/19/2012                              OPERATIVE REPORT   This is a 27-year-old gentleman who has completely flatten dorsal nose, saddle nose deformity, secondary to previous trauma in the past.  He had open reduction, internal fixation done several years ago in the [facility], but evidently the areas have not healed up and he had a total collapse of the area with complete obstruction of airway breathing bilaterally as well as hypertrophy of the right and left inferior turbinates.  All the procedures in detail were explained to the patient preoperatively for the reconstruction using a fascia cartilage graft from the right postauricular area and the right groin to reconstruct the areas.  The donor sites are going to be closed primarily and then reduction of the right and left inferior turbinates with septoplasty. Preoperatively, the patient was sat up and drawn for the midline of the nose as well as demarcation of the upper and lower lateral cartilage areas, as well as the totally flatten dorsum of his nose with severe saddle nose deformity.  The patient demonstrates no bony pre- dimensionality involving his nasal bone secondary to the blow.  He was taken to the operating room, placed on the operating room table in the supine position, was given adequate general anesthesia, intubated orally.  Prep was done to the facial, neck, and groin areas with Betadine solution, walled off with sterile towels and drapes so as to make a sterile field.  A 0.5% Xylocaine with epinephrine was injected locally intranasally as well as to right postauricular area and right groin areas, total of 35 mL used 1-200,000 concentration.  This  was allowed to sit, also 4% cocaine packs were placed.  Total of 4 mL in narrowing.  His nasal airway is for vasoconstriction.  We drew a pattern of the deformity of the nose as saddle nose deformity and superimposed it over to the hard piece of sterile paper.  Right postauricular incision was made in the crease line and carried down through the skin and subcutaneous tissue with a 15 blade to underlying cartilage.  Flap was dissected and then I was able to isolate the posterior cartilage of the conchal region of the ear using the curved and direct scissors.  I was able to remove the large piece, placed in a saline.  The defect then re-closed with a running suture of 4-0 Prolene.  Next, a right groin was used to do a large elliptical excision of skin.  The skin was de- epithelialized over the mid dermis fat and fascia in 1 area.  This was taken in 1 block and placed in saline.  After proper hemostasis, the defect was then re-closed with 2-0 Monocryl x2 layers with subdermal suture of 3-0 Monocryl in a running subcuticular stitch of 3-0 Monocryl. Next, intercartilaginous incisions were carried over the dorsal septum. Mucoperichondrium was dissected bilaterally reveal in the form of septum and depressed.  The   deformed curved areas were excised out using the swivel knife and the forceps were used to remove bony prominences. Next, the dorsal angle scissors were used to elevate over the dorsal nose for space.  I was able to take to fascia and __________ and then I took pieces of the cartilage of the ear into speckles __________ approximately 20 pieces and I placed them within the fascia to make a fascia cartilage __________.  I was able to place this and mold it, suture it together using an 3-0 Monocryl, then placed into the dorsal defect to improve this __________ approximately 95%.  With this, I was able to transfix and stitch to hold it on each side and then __________ using the 3-0 nylon.   Next, the mucosa was then re-closed.  Transfixion stitch of 3-0 Monocryl placed.  Mucosa was then closed with 3-0 Monocryl, and this was after we had done the right and left lateral osteotomies to improve the nasal contour and elevate as much as we could.  This fit nicely with good contour on the profile and I was able to then pack the wounds with Vaseline gauze.  I was able to then put dorsal splint dressings on.  He tolerated all the procedures very well.  Proper dressings were placed.  He was taken to recovery in excellent condition.     Daniel Nunez, M.D.     GLT/MEDQ  D:  02/07/2013  T:  02/08/2013  Job:  [id number] 

## 2013-02-08 NOTE — Op Note (Deleted)
Daniel Nunez, Daniel Nunez              ACCOUNT NO.:  [id number]  MEDICAL RECORD NO.:  [id number]  LOCATION:  [id number]                        FACILITY:  [facility]  PHYSICIAN:  Earvin Hansen L. Kalaya Infantino, M.D.DATE OF BIRTH:  [birth year]  DATE OF PROCEDURE:  02/07/2013 DATE OF DISCHARGE:  08/19/2012                              OPERATIVE REPORT   This is a 27 year old gentleman who has completely flatten dorsal nose, saddle nose deformity, secondary to previous trauma in the past.  He had open reduction, internal fixation done several years ago in the [facility], but evidently the areas have not healed up and he had a total collapse of the area with complete obstruction of airway breathing bilaterally as well as hypertrophy of the right and left inferior turbinates.  All the procedures in detail were explained to the patient preoperatively for the reconstruction using a fascia cartilage graft from the right postauricular area and the right groin to reconstruct the areas.  The donor sites are going to be closed primarily and then reduction of the right and left inferior turbinates with septoplasty. Preoperatively, the patient was sat up and drawn for the midline of the nose as well as demarcation of the upper and lower lateral cartilage areas, as well as the totally flatten dorsum of his nose with severe saddle nose deformity.  The patient demonstrates no bony pre- dimensionality involving his nasal bone secondary to the blow.  He was taken to the operating room, placed on the operating room table in the supine position, was given adequate general anesthesia, intubated orally.  Prep was done to the facial, neck, and groin areas with Betadine solution, walled off with sterile towels and drapes so as to make a sterile field.  A 0.5% Xylocaine with epinephrine was injected locally intranasally as well as to right postauricular area and right groin areas, total of 35 mL used 1-200,000 concentration.  This  was allowed to sit, also 4% cocaine packs were placed.  Total of 4 mL in narrowing.  His nasal airway is for vasoconstriction.  We drew a pattern of the deformity of the nose as saddle nose deformity and superimposed it over to the hard piece of sterile paper.  Right postauricular incision was made in the crease line and carried down through the skin and subcutaneous tissue with a 15 blade to underlying cartilage.  Flap was dissected and then I was able to isolate the posterior cartilage of the conchal region of the ear using the curved and direct scissors.  I was able to remove the large piece, placed in a saline.  The defect then re-closed with a running suture of 4-0 Prolene.  Next, a right groin was used to do a large elliptical excision of skin.  The skin was de- epithelialized over the mid dermis fat and fascia in 1 area.  This was taken in 1 block and placed in saline.  After proper hemostasis, the defect was then re-closed with 2-0 Monocryl x2 layers with subdermal suture of 3-0 Monocryl in a running subcuticular stitch of 3-0 Monocryl. Next, intercartilaginous incisions were carried over the dorsal septum. Mucoperichondrium was dissected bilaterally reveal in the form of septum and depressed.  The  deformed curved areas were excised out using the swivel knife and the forceps were used to remove bony prominences. Next, the dorsal angle scissors were used to elevate over the dorsal nose for space.  I was able to take to fascia and __________ and then I took pieces of the cartilage of the ear into speckles __________ approximately 20 pieces and I placed them within the fascia to make a fascia cartilage __________.  I was able to place this and mold it, suture it together using an 3-0 Monocryl, then placed into the dorsal defect to improve this __________ approximately 95%.  With this, I was able to transfix and stitch to hold it on each side and then __________ using the 3-0 nylon.   Next, the mucosa was then re-closed.  Transfixion stitch of 3-0 Monocryl placed.  Mucosa was then closed with 3-0 Monocryl, and this was after we had done the right and left lateral osteotomies to improve the nasal contour and elevate as much as we could.  This fit nicely with good contour on the profile and I was able to then pack the wounds with Vaseline gauze.  I was able to then put dorsal splint dressings on.  He tolerated all the procedures very well.  Proper dressings were placed.  He was taken to recovery in excellent condition.     Daniel Nunez. Shon Hough, M.D.     Cathie Hoops  D:  02/07/2013  T:  02/08/2013  Job:  [id number]

## 2014-08-13 ENCOUNTER — Encounter (HOSPITAL_COMMUNITY): Payer: Self-pay

## 2014-08-13 ENCOUNTER — Emergency Department (HOSPITAL_COMMUNITY): Payer: BC Managed Care – PPO

## 2014-08-13 ENCOUNTER — Emergency Department (HOSPITAL_COMMUNITY)
Admission: EM | Admit: 2014-08-13 | Discharge: 2014-08-13 | Disposition: A | Discharge status: Home / Self Care | Payer: BC Managed Care – PPO | Attending: Emergency Medicine | Admitting: Emergency Medicine

## 2014-08-13 DIAGNOSIS — Z8739 Personal history of other diseases of the musculoskeletal system and connective tissue: Secondary | ICD-10-CM | POA: Insufficient documentation

## 2014-08-13 DIAGNOSIS — S62344A Nondisplaced fracture of base of fourth metacarpal bone, right hand, initial encounter for closed fracture: Secondary | ICD-10-CM | POA: Diagnosis not present

## 2014-08-13 DIAGNOSIS — Y92009 Unspecified place in unspecified non-institutional (private) residence as the place of occurrence of the external cause: Secondary | ICD-10-CM | POA: Insufficient documentation

## 2014-08-13 DIAGNOSIS — Z880 Allergy status to penicillin: Secondary | ICD-10-CM | POA: Insufficient documentation

## 2014-08-13 DIAGNOSIS — Y998 Other external cause status: Secondary | ICD-10-CM | POA: Insufficient documentation

## 2014-08-13 DIAGNOSIS — Y9389 Activity, other specified: Secondary | ICD-10-CM | POA: Diagnosis not present

## 2014-08-13 DIAGNOSIS — W2201XA Walked into wall, initial encounter: Secondary | ICD-10-CM | POA: Insufficient documentation

## 2014-08-13 DIAGNOSIS — S61411A Laceration without foreign body of right hand, initial encounter: Secondary | ICD-10-CM | POA: Diagnosis present

## 2014-08-13 DIAGNOSIS — S62304A Unspecified fracture of fourth metacarpal bone, right hand, initial encounter for closed fracture: Secondary | ICD-10-CM

## 2014-08-13 MED ORDER — BACITRACIN ZINC 500 UNIT/GM EX OINT
TOPICAL_OINTMENT | CUTANEOUS | Status: AC
  Filled 2014-08-13: qty 0.9

## 2014-08-13 NOTE — Discharge Instructions (Signed)
Wear splint as applied.  Follow-up with hand surgery in the next few days. The contact information for Dr. Melvyn Novasrtmann has been provided in this discharge summary. Call his office on Monday to arrange this appointment.  Ibuprofen 600 mg every 6 hours as needed for pain.   Metacarpal Fractures Fractures of metacarpals are breaks in the bones of the hand. They extend from the knuckles to the wrist. These bones can break in many ways. There are different ways of treating these fractures. HOME CARE  Only exercise as told by your doctor.  Return to activities as told by your doctor.  Go to physical therapy as told by your doctor.  Follow your doctor's advice about driving.  Keep the injured hand raised (elevated) above the level of your heart.  If a plaster, fiberglass, or pre-formed splint was applied:  Wear your splint as told and until you are examined again.  Apply ice on the injury for 15-20 minutes at a time, 03-04 times a day. Put the ice in a plastic bag. Place a towel between your skin and the bag.  Do not get your splint or cast wet. Protect it during bathing with a plastic bag.  Loosen the elastic bandage around the splint if your fingers start to get numb, tingle, get cold, or turn blue.  If the splint is plaster, do not lean it on hard surfaces or put pressure on it for 24 hours after it is put on.  Do not  try to scratch the skin under the cast.  Check the skin around the cast every day. You may put lotion on red or sore areas.  Move the fingers of your casted hand several times a day.  Only take medicine as told by your doctor.  Follow up as told by your doctor. This is very important in order to avoid permanent injury, disability, or lasting (chronic) pain. GET HELP RIGHT AWAY IF:   You develop a rash.  You have problems breathing.  You have any allergy problems.  You have more than a small spot of blood from beneath your cast or splint.  You have redness,  puffiness (swelling), or more pain from beneath your cast or splint.  Yellowish-white fluid (pus) comes from beneath your cast or splint.  You develop a temperature by mouth above 102 F (38.9 C), not controlled by medicine.  You have a bad smell coming from under your cast or splint.  You have problems moving any of your fingers. If you do not have a window in your cast for looking at the wound, a fluid or a little bleeding may show up as a stain on the outside of your cast. Tell your doctor about any stains you see. MAKE SURE YOU:   Understand these instructions.  Will watch your condition.  Will get help right away if you are not doing well or get worse. Document Released: 02/04/2008 Document Revised: 01/02/2014 Document Reviewed: 07/24/2009 [facility] Patient Information 2015 [place], [place]. This information is not intended to replace advice given to you by your health care provider. Make sure you discuss any questions you have with your health care provider.
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
## 2014-12-26 ENCOUNTER — Emergency Department (HOSPITAL_COMMUNITY): Payer: Worker's Compensation

## 2014-12-26 ENCOUNTER — Encounter (HOSPITAL_COMMUNITY): Payer: Self-pay

## 2014-12-26 ENCOUNTER — Emergency Department (HOSPITAL_COMMUNITY)
Admission: EM | Admit: 2014-12-26 | Discharge: 2014-12-26 | Disposition: A | Discharge status: Home / Self Care | Payer: Worker's Compensation | Attending: Emergency Medicine | Admitting: Emergency Medicine

## 2014-12-26 DIAGNOSIS — Y9289 Other specified places as the place of occurrence of the external cause: Secondary | ICD-10-CM | POA: Insufficient documentation

## 2014-12-26 DIAGNOSIS — S93402A Sprain of unspecified ligament of left ankle, initial encounter: Secondary | ICD-10-CM | POA: Insufficient documentation

## 2014-12-26 DIAGNOSIS — X58XXXA Exposure to other specified factors, initial encounter: Secondary | ICD-10-CM | POA: Insufficient documentation

## 2014-12-26 DIAGNOSIS — Z8709 Personal history of other diseases of the respiratory system: Secondary | ICD-10-CM | POA: Diagnosis not present

## 2014-12-26 DIAGNOSIS — Z8739 Personal history of other diseases of the musculoskeletal system and connective tissue: Secondary | ICD-10-CM | POA: Insufficient documentation

## 2014-12-26 DIAGNOSIS — S99912A Unspecified injury of left ankle, initial encounter: Secondary | ICD-10-CM | POA: Diagnosis present

## 2014-12-26 DIAGNOSIS — Y9301 Activity, walking, marching and hiking: Secondary | ICD-10-CM | POA: Insufficient documentation

## 2014-12-26 DIAGNOSIS — Y998 Other external cause status: Secondary | ICD-10-CM | POA: Diagnosis not present

## 2014-12-26 DIAGNOSIS — Z880 Allergy status to penicillin: Secondary | ICD-10-CM | POA: Insufficient documentation

## 2014-12-26 MED ORDER — HYDROCODONE-ACETAMINOPHEN 5-325 MG PO TABS: 1.0000 | ORAL_TABLET | ORAL | Status: DC | PRN

## 2014-12-26 MED ORDER — NAPROXEN 500 MG PO TABS: 500.0000 mg | ORAL_TABLET | Freq: Two times a day (BID) | ORAL | Status: DC

## 2014-12-26 NOTE — ED Provider Notes (Signed)
CSN: [id number]     Arrival date & time 12/26/14  1939 History   First MD Initiated Contact with Patient 12/26/14 2215     Chief Complaint  Patient presents with  . Ankle Pain     (Consider location/radiation/quality/duration/timing/severity/associated sxs/prior Treatment) Patient is a 29 y.o. male presenting with ankle pain. The history is provided by the patient.  Ankle Pain Location:  Ankle Injury: yes   Ankle location:  L ankle Pain details:    Quality:  Throbbing and shooting   Radiates to:  Does not radiate   Severity:  Moderate   Onset quality:  Sudden   Timing:  Constant   Progression:  Worsening Chronicity:  New Dislocation: no   Foreign body present:  No foreign bodies Prior injury to area:  No Relieved by:  Nothing Worsened by:  Activity and bearing weight  Margaree MackintoshDennis W Metzgar is a 29 y.o. male who presents to the ED with left ankle pain that radiates up his leg. He states that he was walking tonight at work and tuned his ankle over and has had pain since then. Patient states that he needs a drug screen for workers comp.   Past Medical History  Diagnosis Date  . Deviated nasal septum 01/2013  . 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 Nasal fracture surgery  2007  . Nasal reconstruction N/A 02/07/2013    Procedure: TOTAL NASAL RECONSTRUCTION ;  Surgeon: Louisa SecondGerald Truesdale, MD;  Location: [facility];  Service: Plastics;  Laterality: N/A;  . Nasal septum surgery N/A 02/07/2013    Procedure: WITH SEPTOPLASTY;  Surgeon: Louisa SecondGerald Truesdale, MD;  Location: [facility];  Service: Plastics;  Laterality: N/A;   History reviewed. No pertinent family history. History  Substance Use Topics  . Smoking status: Never Smoker   . Smokeless tobacco: Never Used  . Alcohol Use: Yes     Comment: occasionally    Review of Systems Negative except as stated in HPI   Allergies  Penicillins  Home Medications   Prior  to Admission medications   Medication Sig Start Date End Date Taking? Authorizing Provider  HYDROcodone-acetaminophen (NORCO/VICODIN) 5-325 MG per tablet Take 1 tablet by mouth every 4 (four) hours as needed. 12/26/14   Hope Orlene OchM Neese, NP  naproxen (NAPROSYN) 500 MG tablet Take 1 tablet (500 mg total) by mouth 2 (two) times daily. 12/26/14   Hope Orlene OchM Neese, NP   BP 138/64 mmHg  Pulse 68  Temp(Src) 98.8 F (37.1 C) (Oral)  Resp 12  Ht 5\' 11"  (1.803 m)  Wt 220 lb (99.791 kg)  BMI 30.70 kg/m2  SpO2 100% Physical Exam  Constitutional: He is oriented to person, place, and time. He appears well-developed and well-nourished.  HENT:  Head: Normocephalic.  Eyes: EOM are normal.  Neck: Neck supple.  Pulmonary/Chest: Effort normal.  Abdominal: Soft. There is no tenderness.  Musculoskeletal:       Left ankle: He exhibits normal range of motion, no swelling, no deformity, no laceration and normal pulse. Tenderness. Lateral malleolus and medial malleolus tenderness found. Achilles tendon normal.  Neurological: He is alert and oriented to person, place, and time. No cranial nerve deficit.  Skin: Skin is warm and dry.  Psychiatric: He has a normal mood and affect. His behavior is normal.  Nursing note and vitals reviewed.   ED Course  Procedures (including critical care time) Labs Review Labs Reviewed - No data to display  Imaging Review Dg Ankle Complete Left  12/26/2014   CLINICAL DATA:  Pain which began suddenly while walking  EXAM: LEFT ANKLE COMPLETE - 3+ VIEW  COMPARISON:  None.  FINDINGS: Frontal, oblique, and lateral views obtained. There is no demonstrable fracture or effusion. Ankle mortise appears intact. No appreciable joint space narrowing. No erosive change.  IMPRESSION: No fracture.  Mortise appears intact.  No appreciable arthropathy.   Electronically Signed   By: Bretta Bang III M.D.   On: 12/26/2014 21:31     MDM  29 y.o. male with left ankle pain s/p injury at work  [employer]. Stable for d/c without neurovascular compromise. ASO, crutches, ice, elevation and follow up with ortho if symptoms persist. Will treat for pain and inflammation. Discussed with the patient clinical and x-ray findings  and all questioned fully answered. He will return if any problems arise.   Final diagnoses:  Ankle sprain, left, initial encounter      [facility], NP 12/26/14 2249  Bethann Berkshire, MD 12/26/14 [phone]

## 2014-12-26 NOTE — ED Notes (Signed)
Patient c/o pain to left leg, patient states tonight he stepped wrong and "rolled his left ankle" patient ambulatory into triage.

## 2014-12-26 NOTE — Discharge Instructions (Signed)
Elevate the area, apply ice and take the medication as needed for pain. Do not drive while taking the narcotic as it will make you sleepy. Follow up with Dr. Romeo AppleHarrison if symptoms persist.

## 2015-02-25 ENCOUNTER — Encounter (HOSPITAL_COMMUNITY): Payer: Self-pay | Admitting: *Deleted

## 2015-02-25 ENCOUNTER — Observation Stay (HOSPITAL_COMMUNITY)
Admission: EM | Admit: 2015-02-25 | Discharge: 2015-02-26 | Disposition: A | Discharge status: Home Health | Payer: Self-pay | Attending: General Surgery | Admitting: General Surgery

## 2015-02-25 DIAGNOSIS — K37 Unspecified appendicitis: Secondary | ICD-10-CM | POA: Diagnosis present

## 2015-02-25 DIAGNOSIS — Z880 Allergy status to penicillin: Secondary | ICD-10-CM | POA: Insufficient documentation

## 2015-02-25 DIAGNOSIS — K3580 Unspecified acute appendicitis: Principal | ICD-10-CM | POA: Insufficient documentation

## 2015-02-25 DIAGNOSIS — R109 Unspecified abdominal pain: Secondary | ICD-10-CM

## 2015-02-25 LAB — CBC WITH DIFFERENTIAL/PLATELET
Basophils Absolute: 0 10*3/uL (ref 0.0–0.1)
Basophils Relative: 0 % (ref 0–1)
EOS PCT: 2 % (ref 0–5)
Eosinophils Absolute: 0.2 10*3/uL (ref 0.0–0.7)
HCT: 39.3 % (ref 39.0–52.0)
Hemoglobin: 13.5 g/dL (ref 13.0–17.0)
LYMPHS PCT: 32 % (ref 12–46)
Lymphs Abs: 2.3 10*3/uL (ref 0.7–4.0)
MCH: 30.7 pg (ref 26.0–34.0)
MCHC: 34.4 g/dL (ref 30.0–36.0)
MCV: 89.3 fL (ref 78.0–100.0)
MONO ABS: 0.4 10*3/uL (ref 0.1–1.0)
MONOS PCT: 5 % (ref 3–12)
Neutro Abs: 4.2 10*3/uL (ref 1.7–7.7)
Neutrophils Relative %: 61 % (ref 43–77)
Platelets: 246 10*3/uL (ref 150–400)
RBC: 4.4 MIL/uL (ref 4.22–5.81)
RDW: 12.7 % (ref 11.5–15.5)
WBC: 7.1 10*3/uL (ref 4.0–10.5)

## 2015-02-25 NOTE — ED Provider Notes (Signed)
CSN: [id number]     Arrival date & time 02/25/15  2108 History   This chart was scribed for Daniel Booze, MD by Arlan Organ, ED Scribe. This patient was seen in room APA18/APA18 and the patient's care was started 11:31 PM.   Chief Complaint  Patient presents with  . Abdominal Pain   The history is provided by the patient. No language interpreter was used.    HPI Comments: Daniel Nunez is a 29 y.o. male without any pertinent past medical history who presents to the Emergency Department complaining of constant, ongoing, mildly improved RUQ abdominal pain x 1 day. Pain is described as sharp and rated 5/10. However, pain was rated 8-9/10 at highest intensity. Pain is exacerbated with deep breathing. No alleviating factors at this time. No OTC medications or home remedies attempted prior to arrival. No recent fever, chills, nausea, or vomiting. No dysuria or hematuria. Mr. Daniel Nunez admits to a lot of heavy lifting during working hours. He is an occasional drinker. Last drink last weekend. Pt with known allergy to Penicillins.  Past Medical History  Diagnosis Date  . Deviated nasal septum 01/2013  . 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 Nasal fracture surgery  2007  . Nasal reconstruction N/A 02/07/2013    Procedure: TOTAL NASAL RECONSTRUCTION ;  Surgeon: Louisa Second, MD;  Location: [facility];  Service: Plastics;  Laterality: N/A;  . Nasal septum surgery N/A 02/07/2013    Procedure: WITH SEPTOPLASTY;  Surgeon: Louisa Second, MD;  Location: [facility];  Service: Plastics;  Laterality: N/A;   No family history on file. History  Substance Use Topics  . Smoking status: Never Smoker   . Smokeless tobacco: Never Used  . Alcohol Use: Yes     Comment: occasionally    Review of Systems  Constitutional: Negative for fever and chills.  Respiratory: Negative for shortness of breath.   Cardiovascular: Negative for chest  pain.  Gastrointestinal: Positive for abdominal pain. Negative for nausea, vomiting and diarrhea.  Genitourinary: Negative for dysuria and hematuria.  Musculoskeletal: Negative for back pain.  Psychiatric/Behavioral: Negative for confusion.  All other systems reviewed and are negative.     Allergies  Penicillins  Home Medications   Prior to Admission medications   Medication Sig Start Date End Date Taking? Authorizing Provider  HYDROcodone-acetaminophen (NORCO/VICODIN) 5-325 MG per tablet Take 1 tablet by mouth every 4 (four) hours as needed. 12/26/14   Hope Orlene Och, NP  naproxen (NAPROSYN) 500 MG tablet Take 1 tablet (500 mg total) by mouth 2 (two) times daily. 12/26/14   Hope Orlene Och, NP   Triage Vitals: BP 128/77 mmHg  Pulse 69  Temp(Src) 98.4 F (36.9 C) (Oral)  Resp 20  Ht 5\' 11"  (1.803 m)  Wt 218 lb (98.884 kg)  BMI 30.42 kg/m2  SpO2 100%   Physical Exam  Constitutional: He is oriented to person, place, and time. He appears well-developed and well-nourished.  HENT:  Head: Normocephalic and atraumatic.  Eyes: EOM are normal. Pupils are equal, round, and reactive to light. No scleral icterus.  Neck: Normal range of motion. Neck supple. No JVD present.  Cardiovascular: Normal rate, regular rhythm, normal heart sounds and intact distal pulses.   No murmur heard. Pulmonary/Chest: Effort normal and breath sounds normal. He has no wheezes. He has no rales. He exhibits no tenderness.  Abdominal: Soft. Bowel sounds are normal. He exhibits no distension and no  mass. There is tenderness. There is no rebound and no guarding.  Mild RUQ tenderness Negative murphy's sign   Musculoskeletal: Normal range of motion. He exhibits no edema.  Lymphadenopathy:    He has no cervical adenopathy.  Neurological: He is alert and oriented to person, place, and time. No cranial nerve deficit. He exhibits normal muscle tone. Coordination normal.  Skin: Skin is warm and dry. No rash noted.   Psychiatric: He has a normal mood and affect. His behavior is normal. Judgment and thought content normal.  Nursing note and vitals reviewed.   ED Course  Procedures (including critical care time)  DIAGNOSTIC STUDIES: Oxygen Saturation is 100% on RA, Normal by my interpretation.    COORDINATION OF CARE: 11:36 PM- Will order CBC, Lipase, and Urinalysis, and CMP. Discussed treatment plan with pt at bedside and pt agreed to plan.       Labs Review Results for orders placed or performed during the hospital encounter of 02/25/15  CBC with Differential  Result Value Ref Range   WBC 7.1 4.0 - 10.5 K/uL   RBC 4.40 4.22 - 5.81 MIL/uL   Hemoglobin 13.5 13.0 - 17.0 g/dL   HCT 76.1 95.0 - 93.2 %   MCV 89.3 78.0 - 100.0 fL   MCH 30.7 26.0 - 34.0 pg   MCHC 34.4 30.0 - 36.0 g/dL   RDW 67.1 24.5 - 80.9 %   Platelets 246 150 - 400 K/uL   Neutrophils Relative % 61 43 - 77 %   Neutro Abs 4.2 1.7 - 7.7 K/uL   Lymphocytes Relative 32 12 - 46 %   Lymphs Abs 2.3 0.7 - 4.0 K/uL   Monocytes Relative 5 3 - 12 %   Monocytes Absolute 0.4 0.1 - 1.0 K/uL   Eosinophils Relative 2 0 - 5 %   Eosinophils Absolute 0.2 0.0 - 0.7 K/uL   Basophils Relative 0 0 - 1 %   Basophils Absolute 0.0 0.0 - 0.1 K/uL  Comprehensive metabolic panel  Result Value Ref Range   Sodium 140 135 - 145 mmol/L   Potassium 4.0 3.5 - 5.1 mmol/L   Chloride 107 101 - 111 mmol/L   CO2 25 22 - 32 mmol/L   Glucose, Bld 89 65 - 99 mg/dL   BUN 15 6 - 20 mg/dL   Creatinine, Ser 9.83 0.61 - 1.24 mg/dL   Calcium 9.1 8.9 - 38.2 mg/dL   Total Protein 8.2 (H) 6.5 - 8.1 g/dL   Albumin 4.8 3.5 - 5.0 g/dL   AST 25 15 - 41 U/L   ALT 35 17 - 63 U/L   Alkaline Phosphatase 87 38 - 126 U/L   Total Bilirubin 0.6 0.3 - 1.2 mg/dL   GFR calc non Af Amer >60 >60 mL/min   GFR calc Af Amer >60 >60 mL/min   Anion gap 8 5 - 15  Lipase, blood  Result Value Ref Range   Lipase 21 (L) 22 - 51 U/L  Urinalysis, Routine w reflex microscopic (not at  [facility])  Result Value Ref Range   Color, Urine YELLOW YELLOW   APPearance CLEAR CLEAR   Specific Gravity, Urine >1.030 (H) 1.005 - 1.030   pH 6.0 5.0 - 8.0   Glucose, UA NEGATIVE NEGATIVE mg/dL   Hgb urine dipstick TRACE (A) NEGATIVE   Bilirubin Urine NEGATIVE NEGATIVE   Ketones, ur NEGATIVE NEGATIVE mg/dL   Protein, ur NEGATIVE NEGATIVE mg/dL   Urobilinogen, UA 0.2 0.0 - 1.0 mg/dL  Nitrite NEGATIVE NEGATIVE   Leukocytes, UA NEGATIVE NEGATIVE  Urine microscopic-add on  Result Value Ref Range   Squamous Epithelial / LPF RARE RARE   WBC, UA 0-2 <3 WBC/hpf   RBC / HPF 3-6 <3 RBC/hpf   Urine-Other MUCOUS PRESENT    Imaging Review Ct Renal Stone Study  02/26/2015   CLINICAL DATA:  Right upper quadrant and right lower quadrant abdominal pain for 1 day.  EXAM: CT ABDOMEN AND PELVIS WITHOUT CONTRAST  TECHNIQUE: Multidetector CT imaging of the abdomen and pelvis was performed following the standard protocol without IV contrast.  COMPARISON:  None.  FINDINGS: There is enlargement and inflammation of the appendix with features typical of acute appendicitis. There is no abscess. There is no extraluminal air.  There are no other acute findings in the abdomen or pelvis. There are grossly unremarkable unenhanced appearances of the liver, spleen, pancreas, adrenals and kidneys. Remainder of the bowel is unremarkable. Abdominal aorta is normal in caliber without atherosclerotic calcification. There is no significant abnormality in the lower chest.  IMPRESSION: Acute appendicitis. These results were called by telephone at the time of interpretation on 02/26/2015 at 1:40 am to Dr. Dione Nunez , who verbally acknowledged these results.   Electronically Signed   By: Ellery Plunk M.D.   On: 02/26/2015 01:41   Images viewed by me, discussed with radiologist.   MDM   Final diagnoses:  Abdominal pain, unspecified abdominal location  Acute appendicitis, unspecified acute appendicitis type    Right upper  quadrant pain of uncertain cause. Exam is benign. Screening labs will be obtained.  Laboratory workup is significant only for 3-6/hpf RBCs in his urine. He will be sent for CT scan to evaluate for possible urolithiasis.  CT shows evidence of appendicitis. Case is discussed with Dr. Carolynne Edouard of general surgery who requests he be started on ciprofloxacin and metronidazole and Dr. Carolynne Edouard will come in for appendectomy. He is admitted to a medical-surgical bed.  I personally performed the services described in this documentation, which was scribed in my presence. The recorded information has been reviewed and is accurate.     Daniel Booze, MD 02/26/15 [phone]

## 2015-02-25 NOTE — ED Notes (Signed)
Pt c/o right side abd pain that started yesterday, denies any fever, n/v/d,

## 2015-02-26 ENCOUNTER — Emergency Department (HOSPITAL_COMMUNITY): Payer: Self-pay

## 2015-02-26 ENCOUNTER — Observation Stay (HOSPITAL_COMMUNITY): Payer: Self-pay | Admitting: Anesthesiology

## 2015-02-26 ENCOUNTER — Encounter (HOSPITAL_COMMUNITY)
Admission: EM | Disposition: A | Discharge status: Home Health | Payer: Self-pay | Source: Home / Self Care | Attending: General Surgery

## 2015-02-26 ENCOUNTER — Encounter (HOSPITAL_COMMUNITY): Payer: Self-pay | Admitting: *Deleted

## 2015-02-26 DIAGNOSIS — K37 Unspecified appendicitis: Secondary | ICD-10-CM | POA: Diagnosis present

## 2015-02-26 DIAGNOSIS — K3580 Unspecified acute appendicitis: Secondary | ICD-10-CM | POA: Diagnosis present

## 2015-02-26 HISTORY — PX: LAPAROSCOPIC APPENDECTOMY: SHX408

## 2015-02-26 LAB — COMPREHENSIVE METABOLIC PANEL
ALT: 35 U/L (ref 17–63)
AST: 25 U/L (ref 15–41)
Albumin: 4.8 g/dL (ref 3.5–5.0)
Alkaline Phosphatase: 87 U/L (ref 38–126)
Anion gap: 8 (ref 5–15)
BILIRUBIN TOTAL: 0.6 mg/dL (ref 0.3–1.2)
BUN: 15 mg/dL (ref 6–20)
CHLORIDE: 107 mmol/L (ref 101–111)
CO2: 25 mmol/L (ref 22–32)
CREATININE: 0.78 mg/dL (ref 0.61–1.24)
Calcium: 9.1 mg/dL (ref 8.9–10.3)
GFR calc Af Amer: >60 mL/min (ref >60)
Glucose, Bld: 89 mg/dL (ref 65–99)
Potassium: 4 mmol/L (ref 3.5–5.1)
Sodium: 140 mmol/L (ref 135–145)
Total Protein: 8.2 g/dL — ABNORMAL HIGH (ref 6.5–8.1)

## 2015-02-26 LAB — URINE MICROSCOPIC-ADD ON

## 2015-02-26 LAB — URINALYSIS, ROUTINE W REFLEX MICROSCOPIC
Bilirubin Urine: NEGATIVE
Glucose, UA: NEGATIVE mg/dL
Ketones, ur: NEGATIVE mg/dL
LEUKOCYTES UA: NEGATIVE
NITRITE: NEGATIVE
PH: 6 (ref 5.0–8.0)
Protein, ur: NEGATIVE mg/dL
Urobilinogen, UA: 0.2 mg/dL (ref 0.0–1.0)

## 2015-02-26 LAB — LIPASE, BLOOD: Lipase: 21 U/L — ABNORMAL LOW (ref 22–51)

## 2015-02-26 LAB — SURGICAL PCR SCREEN
MRSA, PCR: POSITIVE — AB
STAPHYLOCOCCUS AUREUS: POSITIVE — AB

## 2015-02-26 SURGERY — APPENDECTOMY, LAPAROSCOPIC
Anesthesia: General | Site: Abdomen

## 2015-02-26 MED ORDER — LACTATED RINGERS IV SOLN
INTRAVENOUS | Status: DC
Start: 2015-02-26 — End: 2015-02-26
  Administered 2015-02-26: 09:00:00 via INTRAVENOUS

## 2015-02-26 MED ORDER — POVIDONE-IODINE 10 % EX OINT
TOPICAL_OINTMENT | CUTANEOUS | Status: AC
  Filled 2015-02-26: qty 1

## 2015-02-26 MED ORDER — SODIUM CHLORIDE 0.9 % IV SOLN
1000.0000 mL | INTRAVENOUS | Status: DC
  Administered 2015-02-26 (×2): 1000 mL via INTRAVENOUS

## 2015-02-26 MED ORDER — CHLORHEXIDINE GLUCONATE 4 % EX LIQD: 1.0000 "application " | Freq: Once | CUTANEOUS | Status: DC

## 2015-02-26 MED ORDER — METRONIDAZOLE IN NACL 5-0.79 MG/ML-% IV SOLN: 500.0000 mg | Freq: Once | INTRAVENOUS | Status: AC

## 2015-02-26 MED ORDER — MIDAZOLAM HCL 5 MG/5ML IJ SOLN
INTRAMUSCULAR | Status: DC | PRN
  Administered 2015-02-26: 2 mg via INTRAVENOUS

## 2015-02-26 MED ORDER — KETOROLAC TROMETHAMINE 30 MG/ML IJ SOLN
30.0000 mg | Freq: Once | INTRAMUSCULAR | Status: AC
  Administered 2015-02-26: 30 mg via INTRAVENOUS

## 2015-02-26 MED ORDER — MUPIROCIN 2 % EX OINT
1.0000 "application " | TOPICAL_OINTMENT | Freq: Once | CUTANEOUS | Status: AC
  Administered 2015-02-26: 1 via TOPICAL

## 2015-02-26 MED ORDER — MUPIROCIN 2 % EX OINT
TOPICAL_OINTMENT | CUTANEOUS | Status: AC
  Filled 2015-02-26: qty 22

## 2015-02-26 MED ORDER — METRONIDAZOLE IN NACL 5-0.79 MG/ML-% IV SOLN
500.0000 mg | Freq: Once | INTRAVENOUS | Status: AC
  Administered 2015-02-26: 500 mg via INTRAVENOUS

## 2015-02-26 MED ORDER — ONDANSETRON HCL 4 MG/2ML IJ SOLN
4.0000 mg | Freq: Three times a day (TID) | INTRAMUSCULAR | Status: AC | PRN
Start: 2015-02-26 — End: 2015-02-26

## 2015-02-26 MED ORDER — PANTOPRAZOLE SODIUM 40 MG IV SOLR: 40.0000 mg | Freq: Every day | INTRAVENOUS | Status: DC

## 2015-02-26 MED ORDER — CIPROFLOXACIN IN D5W 400 MG/200ML IV SOLN: 400.0000 mg | Freq: Two times a day (BID) | INTRAVENOUS | Status: DC

## 2015-02-26 MED ORDER — PROPOFOL 10 MG/ML IV BOLUS
INTRAVENOUS | Status: DC | PRN
  Administered 2015-02-26: 160 mg via INTRAVENOUS

## 2015-02-26 MED ORDER — SUCCINYLCHOLINE CHLORIDE 20 MG/ML IJ SOLN
INTRAMUSCULAR | Status: AC
  Filled 2015-02-26: qty 2

## 2015-02-26 MED ORDER — ONDANSETRON HCL 4 MG/2ML IJ SOLN: 4.0000 mg | Freq: Four times a day (QID) | INTRAMUSCULAR | Status: DC | PRN

## 2015-02-26 MED ORDER — HYDROMORPHONE HCL 1 MG/ML IJ SOLN
1.0000 mg | INTRAMUSCULAR | Status: DC | PRN
  Administered 2015-02-26: 1 mg via INTRAVENOUS
  Filled 2015-02-26: qty 1

## 2015-02-26 MED ORDER — HYDROMORPHONE HCL 1 MG/ML IJ SOLN: 1.0000 mg | INTRAMUSCULAR | Status: DC | PRN

## 2015-02-26 MED ORDER — FENTANYL CITRATE (PF) 100 MCG/2ML IJ SOLN
INTRAMUSCULAR | Status: DC | PRN
  Administered 2015-02-26 (×2): 50 ug via INTRAVENOUS
  Administered 2015-02-26: 100 ug via INTRAVENOUS

## 2015-02-26 MED ORDER — FENTANYL CITRATE (PF) 100 MCG/2ML IJ SOLN
INTRAMUSCULAR | Status: AC
  Filled 2015-02-26: qty 2

## 2015-02-26 MED ORDER — MIDAZOLAM HCL 2 MG/2ML IJ SOLN
INTRAMUSCULAR | Status: AC
  Filled 2015-02-26: qty 2

## 2015-02-26 MED ORDER — KETOROLAC TROMETHAMINE 30 MG/ML IJ SOLN
INTRAMUSCULAR | Status: AC
Start: 2015-02-26 — End: 2015-02-26
  Filled 2015-02-26: qty 1

## 2015-02-26 MED ORDER — POVIDONE-IODINE 10 % OINT PACKET
TOPICAL_OINTMENT | CUTANEOUS | Status: DC | PRN
  Administered 2015-02-26: 1 via TOPICAL

## 2015-02-26 MED ORDER — LIDOCAINE HCL 1 % IJ SOLN
INTRAMUSCULAR | Status: DC | PRN
  Administered 2015-02-26: 30 mg via INTRADERMAL

## 2015-02-26 MED ORDER — BUPIVACAINE HCL (PF) 0.5 % IJ SOLN
INTRAMUSCULAR | Status: AC
  Filled 2015-02-26: qty 30

## 2015-02-26 MED ORDER — ONDANSETRON HCL 4 MG/2ML IJ SOLN: 4.0000 mg | Freq: Once | INTRAMUSCULAR | Status: DC | PRN

## 2015-02-26 MED ORDER — SODIUM CHLORIDE 0.9 % IV SOLN: INTRAVENOUS | Status: DC

## 2015-02-26 MED ORDER — GLYCOPYRROLATE 0.2 MG/ML IJ SOLN
INTRAMUSCULAR | Status: AC
  Filled 2015-02-26: qty 3

## 2015-02-26 MED ORDER — LIDOCAINE HCL (PF) 1 % IJ SOLN
INTRAMUSCULAR | Status: AC
  Filled 2015-02-26: qty 5

## 2015-02-26 MED ORDER — METRONIDAZOLE IN NACL 5-0.79 MG/ML-% IV SOLN
INTRAVENOUS | Status: AC
  Filled 2015-02-26: qty 100

## 2015-02-26 MED ORDER — SODIUM CHLORIDE 0.9 % IV SOLN
1000.0000 mL | Freq: Once | INTRAVENOUS | Status: AC
  Administered 2015-02-26: 1000 mL via INTRAVENOUS

## 2015-02-26 MED ORDER — MORPHINE SULFATE 2 MG/ML IJ SOLN: 1.0000 mg | INTRAMUSCULAR | Status: DC | PRN

## 2015-02-26 MED ORDER — GLYCOPYRROLATE 0.2 MG/ML IJ SOLN
INTRAMUSCULAR | Status: DC | PRN
  Administered 2015-02-26: 0.6 mg via INTRAVENOUS

## 2015-02-26 MED ORDER — MIDAZOLAM HCL 2 MG/2ML IJ SOLN
1.0000 mg | INTRAMUSCULAR | Status: DC | PRN
Start: 2015-02-26 — End: 2015-02-26
  Administered 2015-02-26: 2 mg via INTRAVENOUS

## 2015-02-26 MED ORDER — METRONIDAZOLE 500 MG PO TABS
500.0000 mg | ORAL_TABLET | Freq: Once | ORAL | Status: AC
  Administered 2015-02-26: 500 mg via ORAL
  Filled 2015-02-26: qty 1

## 2015-02-26 MED ORDER — METRONIDAZOLE IN NACL 5-0.79 MG/ML-% IV SOLN
INTRAVENOUS | Status: AC
  Administered 2015-02-26: 03:00:00
  Filled 2015-02-26: qty 100

## 2015-02-26 MED ORDER — PROPOFOL 10 MG/ML IV BOLUS
INTRAVENOUS | Status: AC
  Filled 2015-02-26: qty 20

## 2015-02-26 MED ORDER — ROCURONIUM BROMIDE 100 MG/10ML IV SOLN
INTRAVENOUS | Status: DC | PRN
  Administered 2015-02-26: 25 mg via INTRAVENOUS
  Administered 2015-02-26: 5 mg via INTRAVENOUS
  Administered 2015-02-26: 10 mg via INTRAVENOUS

## 2015-02-26 MED ORDER — ONDANSETRON HCL 4 MG/2ML IJ SOLN
INTRAMUSCULAR | Status: AC
  Filled 2015-02-26: qty 2

## 2015-02-26 MED ORDER — OXYCODONE-ACETAMINOPHEN 5-325 MG PO TABS
1.0000 | ORAL_TABLET | ORAL | Status: DC | PRN
  Administered 2015-02-26: 2 via ORAL
  Filled 2015-02-26: qty 2

## 2015-02-26 MED ORDER — CIPROFLOXACIN IN D5W 400 MG/200ML IV SOLN
400.0000 mg | Freq: Once | INTRAVENOUS | Status: AC
  Administered 2015-02-26: 400 mg via INTRAVENOUS

## 2015-02-26 MED ORDER — FENTANYL CITRATE (PF) 100 MCG/2ML IJ SOLN: 25.0000 ug | INTRAMUSCULAR | Status: DC | PRN

## 2015-02-26 MED ORDER — SUCCINYLCHOLINE CHLORIDE 20 MG/ML IJ SOLN
INTRAMUSCULAR | Status: DC | PRN
  Administered 2015-02-26: 150 mg via INTRAVENOUS

## 2015-02-26 MED ORDER — ENOXAPARIN SODIUM 40 MG/0.4ML ~~LOC~~ SOLN: 40.0000 mg | SUBCUTANEOUS | Status: DC

## 2015-02-26 MED ORDER — SODIUM CHLORIDE 0.9 % IR SOLN
Status: DC | PRN
  Administered 2015-02-26: 1000 mL

## 2015-02-26 MED ORDER — BUPIVACAINE HCL (PF) 0.5 % IJ SOLN
INTRAMUSCULAR | Status: DC | PRN
Start: 2015-02-26 — End: 2015-02-26
  Administered 2015-02-26: 10 mL

## 2015-02-26 MED ORDER — NEOSTIGMINE METHYLSULFATE 10 MG/10ML IV SOLN
INTRAVENOUS | Status: DC | PRN
  Administered 2015-02-26 (×2): 1 mg via INTRAVENOUS
  Administered 2015-02-26: 2 mg via INTRAVENOUS

## 2015-02-26 MED ORDER — FENTANYL CITRATE (PF) 250 MCG/5ML IJ SOLN
INTRAMUSCULAR | Status: AC
  Filled 2015-02-26: qty 5

## 2015-02-26 MED ORDER — ROCURONIUM BROMIDE 50 MG/5ML IV SOLN
INTRAVENOUS | Status: AC
  Filled 2015-02-26: qty 1

## 2015-02-26 MED ORDER — ONDANSETRON HCL 4 MG/2ML IJ SOLN
4.0000 mg | Freq: Once | INTRAMUSCULAR | Status: AC
  Administered 2015-02-26: 4 mg via INTRAVENOUS

## 2015-02-26 MED ORDER — CIPROFLOXACIN IN D5W 400 MG/200ML IV SOLN
INTRAVENOUS | Status: AC
  Administered 2015-02-26: 400 mg
  Filled 2015-02-26: qty 200

## 2015-02-26 MED ORDER — OXYCODONE-ACETAMINOPHEN 7.5-325 MG PO TABS: 1.0000 | ORAL_TABLET | ORAL | Status: DC | PRN

## 2015-02-26 SURGICAL SUPPLY — 47 items
BAG HAMPER (MISCELLANEOUS) ×2 IMPLANT
CHLORAPREP W/TINT 26ML (MISCELLANEOUS) ×2 IMPLANT
CLOTH BEACON ORANGE TIMEOUT ST (SAFETY) ×2 IMPLANT
COVER LIGHT HANDLE STERIS (MISCELLANEOUS) ×4 IMPLANT
CUTTER FLEX LINEAR 45M (STAPLE) IMPLANT
CUTTER LINEAR ENDO 35 ETS (STAPLE) ×2 IMPLANT
CUTTER LINEAR ENDO 35 ETS TH (STAPLE) IMPLANT
DECANTER SPIKE VIAL GLASS SM (MISCELLANEOUS) ×2 IMPLANT
DISSECTOR BLUNT TIP ENDO 5MM (MISCELLANEOUS) IMPLANT
ELECT REM PT RETURN 9FT ADLT (ELECTROSURGICAL) ×2
ELECTRODE REM PT RTRN 9FT ADLT (ELECTROSURGICAL) ×1 IMPLANT
FILTER SMOKE EVAC LAPAROSHD (FILTER) ×2 IMPLANT
FORMALIN 10 PREFIL 120ML (MISCELLANEOUS) ×2 IMPLANT
GLOVE BIOGEL PI IND STRL 7.0 (GLOVE) ×1 IMPLANT
GLOVE BIOGEL PI INDICATOR 7.0 (GLOVE) ×1
GLOVE ECLIPSE 6.5 STRL STRAW (GLOVE) ×2 IMPLANT
GLOVE SURG SS PI 7.5 STRL IVOR (GLOVE) ×4 IMPLANT
GOWN STRL REUS W/ TWL XL LVL3 (GOWN DISPOSABLE) ×1 IMPLANT
GOWN STRL REUS W/TWL LRG LVL3 (GOWN DISPOSABLE) ×2 IMPLANT
GOWN STRL REUS W/TWL XL LVL3 (GOWN DISPOSABLE) ×1
INST SET LAPROSCOPIC AP (KITS) ×2 IMPLANT
IV NS IRRIG 3000ML ARTHROMATIC (IV SOLUTION) IMPLANT
KIT ROOM TURNOVER APOR (KITS) ×2 IMPLANT
MANIFOLD NEPTUNE II (INSTRUMENTS) ×2 IMPLANT
NEEDLE INSUFFLATION 14GA 120MM (NEEDLE) ×2 IMPLANT
NS IRRIG 1000ML POUR BTL (IV SOLUTION) ×2 IMPLANT
PACK LAP CHOLE LZT030E (CUSTOM PROCEDURE TRAY) ×2 IMPLANT
PAD ARMBOARD 7.5X6 YLW CONV (MISCELLANEOUS) ×2 IMPLANT
PENCIL HANDSWITCHING (ELECTRODE) ×2 IMPLANT
POUCH SPECIMEN RETRIEVAL 10MM (ENDOMECHANICALS) ×2 IMPLANT
RELOAD /EVU35 (ENDOMECHANICALS) IMPLANT
RELOAD 45 VASCULAR/THIN (ENDOMECHANICALS) IMPLANT
RELOAD CUTTER ETS 35MM STAND (ENDOMECHANICALS) IMPLANT
SCALPEL HARMONIC ACE (MISCELLANEOUS) ×2 IMPLANT
SET BASIN LINEN APH (SET/KITS/TRAYS/PACK) ×2 IMPLANT
SET TUBE IRRIG SUCTION NO TIP (IRRIGATION / IRRIGATOR) IMPLANT
SPONGE GAUZE 2X2 8PLY STRL LF (GAUZE/BANDAGES/DRESSINGS) ×6 IMPLANT
STAPLER VISISTAT (STAPLE) ×2 IMPLANT
SUT VICRYL 0 UR6 27IN ABS (SUTURE) ×2 IMPLANT
TAPE CLOTH SURG 4X10 WHT LF (GAUZE/BANDAGES/DRESSINGS) ×2 IMPLANT
TRAY FOLEY CATH SILVER 16FR (SET/KITS/TRAYS/PACK) ×2 IMPLANT
TROCAR ENDO BLADELESS 11MM (ENDOMECHANICALS) ×2 IMPLANT
TROCAR ENDO BLADELESS 12MM (ENDOMECHANICALS) ×2 IMPLANT
TROCAR XCEL NON-BLD 5MMX100MML (ENDOMECHANICALS) ×2 IMPLANT
TUBING INSUFFLATION (TUBING) ×2 IMPLANT
WARMER LAPAROSCOPE (MISCELLANEOUS) ×2 IMPLANT
YANKAUER SUCT 12FT TUBE ARGYLE (SUCTIONS) ×2 IMPLANT

## 2015-02-26 NOTE — Progress Notes (Signed)
1120 Per patient request full liquid diet preferred at this time post surgical.

## 2015-02-26 NOTE — Discharge Instructions (Signed)
Laparoscopic Appendectomy °Care After °Refer to this sheet in the next few weeks. These instructions provide you with information on caring for yourself after your procedure. Your caregiver may also give you more specific instructions. Your treatment has been planned according to current medical practices, but problems sometimes occur. Call your caregiver if you have any problems or questions after your procedure. °HOME CARE INSTRUCTIONS °· Do not drive while taking narcotic pain medicines. °· Use stool softener if you become constipated from your pain medicines. °· Change your bandages (dressings) as directed. °· Keep your wounds clean and dry. You may wash the wounds gently with soap and water. Gently pat the wounds dry with a clean towel. °· Do not take baths, swim, or use hot tubs for 10 days, or as instructed by your caregiver. °· Only take over-the-counter or prescription medicines for pain, discomfort, or fever as directed by your caregiver. °· You may continue your normal diet as directed. °· Do not lift more than 10 pounds (4.5 kg) or play contact sports for 3 weeks, or as directed. °· Slowly increase your activity after surgery. °· Take deep breaths to avoid getting a lung infection (pneumonia). °SEEK MEDICAL CARE IF: °· You have redness, swelling, or increasing pain in your wounds. °· You have pus coming from your wounds. °· You have drainage from a wound that lasts longer than 1 day. °· You notice a bad smell coming from the wounds or dressing. °· Your wound edges break open after stitches (sutures) have been removed. °· You notice increasing pain in the shoulders (shoulder strap areas) or near your shoulder blades. °· You develop dizzy episodes or fainting while standing. °· You develop shortness of breath. °· You develop persistent nausea or vomiting. °· You cannot control your bowel functions or lose your appetite. °· You develop diarrhea. °SEEK IMMEDIATE MEDICAL CARE IF:  °· You have a fever. °· You  develop a rash. °· You have difficulty breathing or sharp pains in your chest. °· You develop any reaction or side effects to medicines given. °MAKE SURE YOU: °· Understand these instructions. °· Will watch your condition. °· Will get help right away if you are not doing well or get worse. °Document Released: 08/18/2005 Document Revised: 11/10/2011 Document Reviewed: 02/25/2011 °ExitCare® Patient Information ©2015 [employer]. This information is not intended to replace advice given to you by your health care provider. Make sure you discuss any questions you have with your health care provider. ° °

## 2015-02-26 NOTE — H&P (Signed)
Daniel Nunez is an 29 y.o. male.   Chief Complaint: abdominal pain HPI: The pt is a 30 yo wm who presents to the ER with abdominal pain that started Friday night. Pain has gotten more severe during the weekend. Denies nausea or vomiting. Denies fever. Pain is mostly in RLQ. CT shows appendicitis with no sign of perforation  Past Medical History  Diagnosis Date  . Deviated nasal septum 01/2013  . 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 Nasal fracture surgery  2007  . Nasal reconstruction N/A 02/07/2013    Procedure: TOTAL NASAL RECONSTRUCTION ;  Surgeon: Cristine Polio, MD;  Location: [facility];  Service: Plastics;  Laterality: N/A;  . Nasal septum surgery N/A 02/07/2013    Procedure: WITH SEPTOPLASTY;  Surgeon: Cristine Polio, MD;  Location: [facility];  Service: Plastics;  Laterality: N/A;    No family history on file. Social History:  reports that he has never smoked. He has never used smokeless tobacco. He reports that he drinks alcohol. He reports that he does not use illicit drugs.  Allergies:  Allergies  Allergen Reactions  . Penicillins Other (See Comments)    UNKNOWN - WAS AS A CHILD    Medications Prior to Admission  Medication Sig Dispense Refill  . HYDROcodone-acetaminophen (NORCO/VICODIN) 5-325 MG per tablet Take 1 tablet by mouth every 4 (four) hours as needed. 10 tablet 0  . naproxen (NAPROSYN) 500 MG tablet Take 1 tablet (500 mg total) by mouth 2 (two) times daily. 20 tablet 0    Results for orders placed or performed during the hospital encounter of 02/25/15 (from the past 48 hour(s))  CBC with Differential     Status: None   Collection Time: 02/25/15 11:12 PM  Result Value Ref Range   WBC 7.1 4.0 - 10.5 K/uL   RBC 4.40 4.22 - 5.81 MIL/uL   Hemoglobin 13.5 13.0 - 17.0 g/dL   HCT 39.3 39.0 - 52.0 %   MCV 89.3 78.0 - 100.0 fL   MCH 30.7 26.0 - 34.0 pg   MCHC 34.4 30.0 - 36.0 g/dL   RDW 12.7 11.5 - 15.5 %   Platelets 246 150 - 400 K/uL   Neutrophils Relative % 61 43 - 77 %   Neutro Abs 4.2 1.7 - 7.7 K/uL   Lymphocytes Relative 32 12 - 46 %   Lymphs Abs 2.3 0.7 - 4.0 K/uL   Monocytes Relative 5 3 - 12 %   Monocytes Absolute 0.4 0.1 - 1.0 K/uL   Eosinophils Relative 2 0 - 5 %   Eosinophils Absolute 0.2 0.0 - 0.7 K/uL   Basophils Relative 0 0 - 1 %   Basophils Absolute 0.0 0.0 - 0.1 K/uL  Comprehensive metabolic panel     Status: Abnormal   Collection Time: 02/25/15 11:12 PM  Result Value Ref Range   Sodium 140 135 - 145 mmol/L   Potassium 4.0 3.5 - 5.1 mmol/L   Chloride 107 101 - 111 mmol/L   CO2 25 22 - 32 mmol/L   Glucose, Bld 89 65 - 99 mg/dL   BUN 15 6 - 20 mg/dL   Creatinine, Ser 0.78 0.61 - 1.24 mg/dL   Calcium 9.1 8.9 - 10.3 mg/dL   Total Protein 8.2 (H) 6.5 - 8.1 g/dL   Albumin 4.8 3.5 - 5.0 g/dL   AST 25 15 - 41 U/L   ALT 35 17 - 63  U/L   Alkaline Phosphatase 87 38 - 126 U/L   Total Bilirubin 0.6 0.3 - 1.2 mg/dL   GFR calc non Af Amer >60 >60 mL/min   GFR calc Af Amer >60 >60 mL/min    Comment: (NOTE) The eGFR has been calculated using the CKD EPI equation. This calculation has not been validated in all clinical situations. eGFR's persistently <60 mL/min signify possible Chronic Kidney Disease.    Anion gap 8 5 - 15  Lipase, blood     Status: Abnormal   Collection Time: 02/25/15 11:12 PM  Result Value Ref Range   Lipase 21 (L) 22 - 51 U/L  Urinalysis, Routine w reflex microscopic (not at [facility])     Status: Abnormal   Collection Time: 02/25/15 11:37 PM  Result Value Ref Range   Color, Urine YELLOW YELLOW   APPearance CLEAR CLEAR   Specific Gravity, Urine >1.030 (H) 1.005 - 1.030   pH 6.0 5.0 - 8.0   Glucose, UA NEGATIVE NEGATIVE mg/dL   Hgb urine dipstick TRACE (A) NEGATIVE   Bilirubin Urine NEGATIVE NEGATIVE   Ketones, ur NEGATIVE NEGATIVE mg/dL   Protein, ur NEGATIVE NEGATIVE mg/dL   Urobilinogen, UA 0.2 0.0 - 1.0 mg/dL   Nitrite  NEGATIVE NEGATIVE   Leukocytes, UA NEGATIVE NEGATIVE  Urine microscopic-add on     Status: None   Collection Time: 02/25/15 11:37 PM  Result Value Ref Range   Squamous Epithelial / LPF RARE RARE    Comment: RARE   WBC, UA 0-2 <3 WBC/hpf    Comment: 0-2   RBC / HPF 3-6 <3 RBC/hpf    Comment: 3-6   Urine-Other MUCOUS PRESENT     Comment: MUCOUS PRESENT   Ct Renal Stone Study  02/26/2015   CLINICAL DATA:  Right upper quadrant and right lower quadrant abdominal pain for 1 day.  EXAM: CT ABDOMEN AND PELVIS WITHOUT CONTRAST  TECHNIQUE: Multidetector CT imaging of the abdomen and pelvis was performed following the standard protocol without IV contrast.  COMPARISON:  None.  FINDINGS: There is enlargement and inflammation of the appendix with features typical of acute appendicitis. There is no abscess. There is no extraluminal air.  There are no other acute findings in the abdomen or pelvis. There are grossly unremarkable unenhanced appearances of the liver, spleen, pancreas, adrenals and kidneys. Remainder of the bowel is unremarkable. Abdominal aorta is normal in caliber without atherosclerotic calcification. There is no significant abnormality in the lower chest.  IMPRESSION: Acute appendicitis. These results were called by telephone at the time of interpretation on 02/26/2015 at 1:40 am to Dr. Delora Fuel , who verbally acknowledged these results.   Electronically Signed   By: Andreas Newport M.D.   On: 02/26/2015 01:41    Review of Systems  Constitutional: Negative.   HENT: Negative.   Eyes: Negative.   Respiratory: Negative.   Cardiovascular: Negative.   Gastrointestinal: Positive for abdominal pain. Negative for nausea and vomiting.  Genitourinary: Negative.   Musculoskeletal: Negative.   Skin: Negative.   Neurological: Negative.   Endo/Heme/Allergies: Negative.   Psychiatric/Behavioral: Negative.     Blood pressure 112/63, pulse 55, temperature 98.6 F (37 C), temperature source  Oral, resp. rate 18, height _0  (1.803 m), weight 97.342 kg (214 lb 9.6 oz), SpO2 98 %. Physical Exam  Constitutional: He is oriented to person, place, and time. He appears well-developed and well-nourished.  HENT:  Head: Normocephalic and atraumatic.  Eyes: Conjunctivae and EOM are normal. Pupils are  equal, round, and reactive to light.  Neck: Normal range of motion. Neck supple.  Cardiovascular: Normal rate, regular rhythm and normal heart sounds.   Respiratory: Effort normal and breath sounds normal.  GI: Soft.  There is tenderness in the RLQ but no guarding or peritonitis  Musculoskeletal: Normal range of motion.  Neurological: He is alert and oriented to person, place, and time.  Skin: Skin is warm and dry.  Psychiatric: He has a normal mood and affect. His behavior is normal.     Assessment/Plan The pt appears to have acute appendicitis. Because of the risk of perforation and sepsis I think he would benefit from having the appendix removed. I have discussed with him the risks and benefits of the surgery as well as some of the technical aspects and he understands and wishes to proceed. I will discuss with the primary team in the am. Will start cipro flagyl .  TOTH III,Audria Takeshita S 02/26/2015, 6:36 AM

## 2015-02-26 NOTE — Progress Notes (Signed)
0140 D/C instructions via written and verbal given to patient, hard Rx given to patient. Patient aware to f/u with Dr.Jenkins as scheduled on 03/08/15 @ 11:15am. Patient aware to monitor for any s/s of infection and report to MD immediately. IV catheter removed from RIGHT arm, catheter tip intact, no s/s of infection or injury noted, patient tolerated well w/no c/o pain or discomfort noted. Surgical dressings intact, no break through drainage noted. Patient contacted girlfriend to transport him home and is waiting for a ride now.

## 2015-02-26 NOTE — Anesthesia Preprocedure Evaluation (Addendum)
Anesthesia Evaluation  Patient identified by MRN, date of birth, ID band Patient awake    Reviewed: Allergy & Precautions, NPO status , Patient's Chart, lab work & pertinent test results  Airway Mallampati: I  TM Distance: >3 FB     Dental  (+) Teeth Intact   Pulmonary neg pulmonary ROS,    Pulmonary exam normal       Cardiovascular negative cardio ROS  Rhythm:Regular Rate:Normal     Neuro/Psych    GI/Hepatic negative GI ROS,   Endo/Other    Renal/GU      Musculoskeletal   Abdominal   Peds  Hematology   Anesthesia Other Findings   Reproductive/Obstetrics                             Anesthesia Physical Anesthesia Plan  ASA: I and emergent  Anesthesia Plan: General   Post-op Pain Management:    Induction: Intravenous, Rapid sequence and Cricoid pressure planned  Airway Management Planned: Oral ETT  Additional Equipment:   Intra-op Plan:   Post-operative Plan: Extubation in OR  Informed Consent: I have reviewed the patients History and Physical, chart, labs and discussed the procedure including the risks, benefits and alternatives for the proposed anesthesia with the patient or authorized representative who has indicated his/her understanding and acceptance.     Plan Discussed with:   Anesthesia Plan Comments:         Anesthesia Quick Evaluation

## 2015-02-26 NOTE — Anesthesia Procedure Notes (Signed)
Procedure Name: Intubation Date/Time: 02/26/2015 9:31 AM Performed by: Despina Hidden Pre-anesthesia Checklist: Patient identified, Emergency Drugs available, Patient being monitored and Suction available Patient Re-evaluated:Patient Re-evaluated prior to inductionOxygen Delivery Method: Circle system utilized Preoxygenation: Pre-oxygenation with 100% oxygen Intubation Type: IV induction, Rapid sequence and Cricoid Pressure applied Ventilation: Mask ventilation without difficulty Laryngoscope Size: Mac and 3 Grade View: Grade I Tube size: 8.0 mm Number of attempts: 1 Airway Equipment and Method: Stylet and Oral airway Placement Confirmation: ETT inserted through vocal cords under direct vision,  positive ETCO2 and breath sounds checked- equal and bilateral Secured at: 23 cm Tube secured with: Tape Dental Injury: Teeth and Oropharynx as per pre-operative assessment

## 2015-02-26 NOTE — Op Note (Signed)
Patient:  Daniel Nunez  DOB:  Jan 03, 1986  MRN:  [id number]   Preop Diagnosis:  Acute appendicitis  Postop Diagnosis:  Same  Procedure:  Laparoscopic appendectomy  Surgeon:  Franky Macho, M.D.  Anes:  Gen. endotracheal  Indications:  Patient is a 29 year old white male who presented emergency room with a 24-hour history of worsening right lower quadrant abdominal pain. CT scan the abdomen revealed acute appendicitis. The risks and benefits of the procedure including bleeding, infection, and the possibility of an open procedure were fully explained to the patient, who gave informed consent.  Procedure note:  The patient was placed the supine position. After induction of general endotracheal anesthesia, the abdomen was prepped and draped using usual sterile technique with DuraPrep. Surgical site confirmation was performed.  A supraumbilical incision was made down to the fascia. A Veress needle was introduced into the abdominal cavity and confirmation of placement was done using the saline drop test. The abdomen was then insufflated to 16 mmHg pressure. An 11 mm trocar was introduced into the abdominal cavity under direct visualization without difficulty. The patient was placed in deeper Trendelenburg position and an additional 12 mm trocar was placed the suprapubic region and a 5 mm trocar was placed left lower quadrant region. The appendix was visualized and was noted to be diffusely inflamed. There was no evidence of perforation. The mesoappendix was divided using the harmonic scalpel. A standard Endo GIA was placed across the base the appendix and fired. The appendix was then removed using an Endo Catch bag without difficulty. He was sent to pathology further examination. The staple line was inspected and noted to be within normal limits. All fluid and air were then evacuated from the abdominal cavity prior to removal of the trochars.  All wounds were irrigated with normal saline. All wounds  were injected with 0.5% Sensorcaine. The supraumbilical fascia as well as suprapubic fascia were reapproximated using 0 Vicryl interrupted sutures. All skin incisions were closed using staples. Betadine ointment and dry sterile dressings were applied.  All tape and needle counts were correct at the end of the procedure. Patient was extubated in the operating room and transferred to PACU in stable condition.  Complications:  None  EBL:  Minimal  Specimen:  Appendix

## 2015-02-26 NOTE — Transfer of Care (Signed)
Immediate Anesthesia Transfer of Care Note  Patient: Daniel Nunez  Procedure(s) Performed: Procedure(s): APPENDECTOMY LAPAROSCOPIC (N/A)  Patient Location: PACU  Anesthesia Type:General  Level of Consciousness: awake and patient cooperative  Airway & Oxygen Therapy: Patient Spontanous Breathing and Patient connected to face mask oxygen  Post-op Assessment: Report given to RN, Post -op Vital signs reviewed and stable and Patient moving all extremities  Post vital signs: Reviewed and stable  Last Vitals:  Filed Vitals:   02/26/15 0915  BP: 146/83  Pulse:   Temp:   Resp: 22    Complications: No apparent anesthesia complications

## 2015-02-26 NOTE — Care Management Note (Signed)
Case Management Note  Patient Details  Name: Daniel Nunez MRN: [id number] Date of Birth: [birth year]  Expected Discharge Date:                  Expected Discharge Plan:  Home/Self Care  In-House Referral:  Financial Counselor, NA  Discharge planning Services  CM Consult  Post Acute Care Choice:  NA Choice offered to:  NA  DME Arranged:    DME Agency:     HH Arranged:    HH Agency:     Status of Service:     Medicare Important Message Given:    Date Medicare IM Given:    Medicare IM give by:    Date Additional Medicare IM Given:    Additional Medicare Important Message give by:     If discussed at Long Length of Stay Meetings, dates discussed:    Additional Comments: Pt is from home, independent at baseline. Pt referred to financial counselor for being uninsured. Pt will f/u with surgeon after discharge. Pt's discharge meds not [facility]. No CM needs.   Malcolm Metrohildress, Avigail Pilling Demske, RN 02/26/2015, 12:03 PM

## 2015-02-26 NOTE — Anesthesia Postprocedure Evaluation (Signed)
  Anesthesia Post-op Note  Patient: Daniel Nunez  Procedure(s) Performed: Procedure(s): APPENDECTOMY LAPAROSCOPIC (N/A)  Patient Location: PACU  Anesthesia Type:General  Level of Consciousness: awake, alert , oriented and patient cooperative  Airway and Oxygen Therapy: Patient Spontanous Breathing  Post-op Pain: 2 /10, mild  Post-op Assessment: Post-op Vital signs reviewed, Patient's Cardiovascular Status Stable, Respiratory Function Stable, Patent Airway, No signs of Nausea or vomiting and Pain level controlled              Post-op Vital Signs: Reviewed and stable  Last Vitals:  Filed Vitals:   02/26/15 1025  BP:   Pulse:   Temp: 36.6 C  Resp: 17    Complications: No apparent anesthesia complications

## 2015-02-26 NOTE — Progress Notes (Signed)
1315 Patient consumed 100% of lunch with no c/o pain, nausea or vomiting. Tolerated full liquid well. Patient has voided x 2 times at this time. Surgical dressings in place, dry with no break through drainage noted at this time. VSS. Patient voiced he is ready to go home.

## 2015-02-27 ENCOUNTER — Encounter (HOSPITAL_COMMUNITY): Payer: Self-pay | Admitting: General Surgery

## 2015-02-27 NOTE — Discharge Summary (Signed)
Physician Discharge Summary  Patient ID: Daniel Nunez MRN: [id number] DOB/AGE: 1986/04/02 29 y.o.  Admit date: 02/25/2015 Discharge date: 02/26/2015 Admission Diagnoses: Acute appendicitis  Discharge Diagnoses: Same Active Problems:   Appendicitis, acute   Appendicitis   Discharged Condition: good  Hospital Course: Patient is a 29 year old white male who presented to the emergency room with a greater than 24 hour history of worsening right lower quadrant abdominal pain. CT scan of the abdomen revealed acute appendicitis. He underwent a laparoscopic appendectomy on 02/26/2015. He tolerated the procedure well. His postoperative course was unremarkable. He tolerated regular diet well. He was discharged home on 02/26/2015 in good and improving condition.  Treatments: surgery: Laparoscopic appendectomy on 02/26/2015  Discharge Exam: Blood pressure 113/63, pulse 67, temperature 98.1 F (36.7 C), temperature source Oral, resp. rate 18, height 5\' 11"  (1.803 m), weight 97.342 kg (214 lb 9.6 oz), SpO2 100 %. General appearance: alert, cooperative and no distress Resp: clear to auscultation bilaterally Cardio: regular rate and rhythm, S1, S2 normal, no murmur, click, rub or gallop GI: Soft, dressings dry and intact.  Disposition: Home    Medication List    STOP taking these medications        HYDROcodone-acetaminophen 5-325 MG per tablet  Commonly known as:  NORCO/VICODIN      TAKE these medications        naproxen 500 MG tablet  Commonly known as:  NAPROSYN  Take 1 tablet (500 mg total) by mouth 2 (two) times daily.     oxyCODONE-acetaminophen 7.5-325 MG per tablet  Commonly known as:  PERCOCET  Take 1-2 tablets by mouth every 4 (four) hours as needed.           Follow-up Information    Follow up with Dalia HeadingJENKINS,Kyrianna Barletta A, MD On 03/08/2015.   Specialty:  General Surgery   Why:  at 11:15 am   Contact information:   [address] [place] [id number] [phone]        Signed: Franky MachoJENKINS,Yvanna Vidas A 02/27/2015, 9:05 AM

## 2015-03-04 ENCOUNTER — Emergency Department (HOSPITAL_COMMUNITY)
Admission: EM | Admit: 2015-03-04 | Discharge: 2015-03-04 | Disposition: A | Discharge status: Home / Self Care | Payer: Self-pay | Attending: Emergency Medicine | Admitting: Emergency Medicine

## 2015-03-04 ENCOUNTER — Encounter (HOSPITAL_COMMUNITY): Payer: Self-pay | Admitting: Emergency Medicine

## 2015-03-04 DIAGNOSIS — L0291 Cutaneous abscess, unspecified: Secondary | ICD-10-CM

## 2015-03-04 DIAGNOSIS — Z9089 Acquired absence of other organs: Secondary | ICD-10-CM | POA: Insufficient documentation

## 2015-03-04 DIAGNOSIS — Z8739 Personal history of other diseases of the musculoskeletal system and connective tissue: Secondary | ICD-10-CM | POA: Insufficient documentation

## 2015-03-04 DIAGNOSIS — Z880 Allergy status to penicillin: Secondary | ICD-10-CM | POA: Insufficient documentation

## 2015-03-04 DIAGNOSIS — Z8709 Personal history of other diseases of the respiratory system: Secondary | ICD-10-CM | POA: Insufficient documentation

## 2015-03-04 DIAGNOSIS — L02212 Cutaneous abscess of back [any part, except buttock]: Secondary | ICD-10-CM | POA: Insufficient documentation

## 2015-03-04 MED ORDER — SULFAMETHOXAZOLE-TRIMETHOPRIM 800-160 MG PO TABS
1.0000 | ORAL_TABLET | Freq: Two times a day (BID) | ORAL | Status: AC
Start: 2015-03-04 — End: 2015-03-11

## 2015-03-04 NOTE — ED Notes (Signed)
PA Julie Idol at bedside. 

## 2015-03-04 NOTE — Discharge Instructions (Signed)

## 2015-03-04 NOTE — ED Notes (Signed)
Patient reports he noticed a bump pop up above his buttocks yesterday. States area is sore to the touch and is red and swollen. States that he had appendectomy on 02/26/15. Patient stated he was concerned that he may have MRSA.

## 2015-03-05 ENCOUNTER — Other Ambulatory Visit (HOSPITAL_COMMUNITY)
Admission: RE | Admit: 2015-03-05 | Discharge: 2015-03-05 | Disposition: A | Discharge status: Home / Self Care | Payer: No Typology Code available for payment source | Source: Ambulatory Visit | Attending: Family Medicine | Admitting: Family Medicine

## 2015-03-05 DIAGNOSIS — L0291 Cutaneous abscess, unspecified: Secondary | ICD-10-CM | POA: Insufficient documentation

## 2015-03-05 NOTE — ED Provider Notes (Signed)
CSN: [id number]     Arrival date & time 03/04/15  2111 History   First MD Initiated Contact with Patient 03/04/15 2140     Chief Complaint  Patient presents with  . Abscess     (Consider location/radiation/quality/duration/timing/severity/associated sxs/prior Treatment) Patient is a 29 y.o. male presenting with abscess. The history is provided by the patient.  Abscess Associated symptoms: no fever    Daniel Nunez is a 29 y.o. male who is currently 6 days out from laparoscopic appendectomy presenting with a 1 day history of a pimple which developed yesterday on his left lower back which has become larger and more painful today. He denies fevers, chills, nausea, vomiting,injury at the site and his surgical sites are not causing any problems.  He is scheduled to have the staples removed in 4 days.Marland Kitchen He denies a history of mrsa but is concerned about this possibility given his recent hospital stay.  He has had no treatments prior to arrival.       Past Medical History  Diagnosis Date  . Deviated nasal septum 01/2013  . 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 Nasal fracture surgery  2007  . Nasal reconstruction N/A 02/07/2013    Procedure: TOTAL NASAL RECONSTRUCTION ;  Surgeon: Louisa Second, MD;  Location: [facility];  Service: Plastics;  Laterality: N/A;  . Nasal septum surgery N/A 02/07/2013    Procedure: WITH SEPTOPLASTY;  Surgeon: Louisa Second, MD;  Location: [facility];  Service: Plastics;  Laterality: N/A;  . Laparoscopic appendectomy N/A 02/26/2015    Procedure: APPENDECTOMY LAPAROSCOPIC;  Surgeon: Franky Macho, MD;  Location: [facility];  Service: General;  Laterality: N/A;   History reviewed. No pertinent family history. History  Substance Use Topics  . Smoking status: Never Smoker   . Smokeless tobacco: Never Used  . Alcohol Use: Yes     Comment: occasionally    Review of Systems  Constitutional:  Negative for fever and chills.  Respiratory: Negative for shortness of breath and wheezing.   Skin: Positive for color change and wound.  Neurological: Negative for numbness.      Allergies  Penicillins  Home Medications   Prior to Admission medications   Medication Sig Start Date End Date Taking? Authorizing Provider  naproxen (NAPROSYN) 500 MG tablet Take 1 tablet (500 mg total) by mouth 2 (two) times daily. Patient not taking: Reported on 02/26/2015 12/26/14   Janne Napoleon, NP  oxyCODONE-acetaminophen (PERCOCET) 7.5-325 MG per tablet Take 1-2 tablets by mouth every 4 (four) hours as needed. 02/26/15   Franky Macho, MD  sulfamethoxazole-trimethoprim (BACTRIM DS,SEPTRA DS) 800-160 MG per tablet Take 1 tablet by mouth 2 (two) times daily. 03/04/15 03/11/15  Raynelle Fanning Sterling Ucci, PA-C   BP 140/88 mmHg  Pulse 90  Temp(Src) 98.4 F (36.9 C) (Oral)  Resp 18  Ht  (1.803 m)  Wt 214 lb (97.07 kg)  BMI 29.86 kg/m2  SpO2 100% Physical Exam  Constitutional: He is oriented to person, place, and time. He appears well-developed and well-nourished.  HENT:  Head: Normocephalic.  Cardiovascular: Normal rate.   Pulmonary/Chest: Effort normal.  Neurological: He is alert and oriented to person, place, and time. No sensory deficit.  Skin:  0.5 cm raised pustule left lower lateral back. There is an additional 0.5 cm surrounding induration and erythema. No drainage.    ED Course  Procedures (including critical care time)  INCISION AND DRAINAGE Performed by:  Dehaven Sine Consent: Verbal consent obtained. Risks and benefits: risks, benefits and alternatives were discussed Type: abscess  Body area: left lower back  Anesthesia: local infiltration  Incision was made with a scalpel.  Local anesthetic: none, discussed anesthesia vs simple stab through the pustule top which should not be painful. Pt preferred no anesthetic injections  Anesthetic total: 0 ml  Complexity: simple   Drainage:  purulent  Drainage amount: small  Packing material: na  Patient tolerance: Patient tolerated the procedure well with no immediate complications.    Labs Review Labs Reviewed  CULTURE, ROUTINE-ABSCESS    Imaging Review No results found.   EKG Interpretation None      MDM   Final diagnoses:  Abscess    Abscess cx sent.  Pt was advised to start warm soaks tid to qid, prescribed bactrim.  F/u with pcp for a recheck for any worsened sx (or his surgeon who he is scheduled to see in 4 days). Incision sites not examined as they were bandaged and pt had no complaints regarding these sites.    Burgess AmorJulie Sakai Wolford, PA-C 03/05/15 2352  Samuel JesterKathleen McManus, DO 03/06/15 [phone]

## 2015-03-07 ENCOUNTER — Telehealth (HOSPITAL_BASED_OUTPATIENT_CLINIC_OR_DEPARTMENT_OTHER): Payer: Self-pay | Admitting: Emergency Medicine

## 2015-03-08 LAB — CULTURE, ROUTINE-ABSCESS: Special Requests: NORMAL

## 2015-06-10 ENCOUNTER — Encounter (HOSPITAL_COMMUNITY): Payer: Self-pay | Admitting: *Deleted

## 2015-06-10 ENCOUNTER — Emergency Department (HOSPITAL_COMMUNITY)
Admission: EM | Admit: 2015-06-10 | Discharge: 2015-06-11 | Disposition: A | Discharge status: Home / Self Care | Payer: BLUE CROSS/BLUE SHIELD | Attending: Emergency Medicine | Admitting: Emergency Medicine

## 2015-06-10 DIAGNOSIS — Z8709 Personal history of other diseases of the respiratory system: Secondary | ICD-10-CM | POA: Diagnosis not present

## 2015-06-10 DIAGNOSIS — Z880 Allergy status to penicillin: Secondary | ICD-10-CM | POA: Diagnosis not present

## 2015-06-10 DIAGNOSIS — Z8739 Personal history of other diseases of the musculoskeletal system and connective tissue: Secondary | ICD-10-CM | POA: Diagnosis not present

## 2015-06-10 DIAGNOSIS — F10920 Alcohol use, unspecified with intoxication, uncomplicated: Secondary | ICD-10-CM

## 2015-06-10 DIAGNOSIS — R112 Nausea with vomiting, unspecified: Secondary | ICD-10-CM

## 2015-06-10 DIAGNOSIS — R4781 Slurred speech: Secondary | ICD-10-CM | POA: Diagnosis not present

## 2015-06-10 DIAGNOSIS — F10129 Alcohol abuse with intoxication, unspecified: Secondary | ICD-10-CM | POA: Insufficient documentation

## 2015-06-10 MED ORDER — ONDANSETRON HCL 4 MG/2ML IJ SOLN
4.0000 mg | Freq: Once | INTRAMUSCULAR | Status: AC
  Administered 2015-06-11: 4 mg via INTRAVENOUS
  Filled 2015-06-10: qty 2

## 2015-06-10 MED ORDER — SODIUM CHLORIDE 0.9 % IV SOLN
1000.0000 mL | INTRAVENOUS | Status: DC
  Administered 2015-06-11: 1000 mL via INTRAVENOUS

## 2015-06-10 MED ORDER — SODIUM CHLORIDE 0.9 % IV SOLN
1000.0000 mL | Freq: Once | INTRAVENOUS | Status: AC
  Administered 2015-06-10: 1000 mL via INTRAVENOUS

## 2015-06-10 NOTE — ED Provider Notes (Signed)
CSN: [id number]     Arrival date & time 06/10/15  2309 History  By signing my name below, I, Lyndel Safe, attest that this documentation has been prepared under the direction and in the presence of Devoria Albe, MD at 23:20 PM. Electronically Signed: Lyndel Safe, ED Scribe. 06/10/2015. 11:36 PM.  Chief Complaint  Patient presents with  . Emesis   The history is provided by the patient and a significant other. No language interpreter was used.   HPI Comments: Daniel Nunez is a 29 y.o. male, with no chronic medical conditions, who presents to the Emergency Department complaining of 2 episodes of emesis s/p EtOH consumption. Per fiance, the pt was out drinking beer with several friends and she reports when she picked him up from the bar tonight he was acting abnormal with vomiting and difficulty walking. He states he feels 'woozy'. Pt reports he drank 12+ beers PTA which is his normal when he drinks. Pt states he drinks several times a month. He reports he has not taken any other substances besides consuming EtOH this evening.  Denies abdominal pain or diarrhea. Also denies fall or head trauma. States he doesn't feel right, when he walks he is "swerving". When a comment  was made that that was a lot of beer to drink one time patient becomes very aggressive and verbally abusive to the examiner. Girlfriend is concerned that he has "alcohol poisoning", but also states "he didn't drink much tonight".   PCP Dr Regino Schultze  Past Medical History  Diagnosis Date  . Deviated nasal septum 01/2013  . 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 Nasal fracture surgery  2007  . Nasal reconstruction N/A 02/07/2013    Procedure: TOTAL NASAL RECONSTRUCTION ;  Surgeon: Louisa Second, MD;  Location: [facility];  Service: Plastics;  Laterality: N/A;  . Nasal septum surgery N/A 02/07/2013    Procedure: WITH SEPTOPLASTY;  Surgeon: Louisa Second, MD;  Location:  [facility];  Service: Plastics;  Laterality: N/A;  . Laparoscopic appendectomy N/A 02/26/2015    Procedure: APPENDECTOMY LAPAROSCOPIC;  Surgeon: Franky Macho, MD;  Location: [facility];  Service: General;  Laterality: N/A;  . Appendectomy     History reviewed. No pertinent family history. Social History  Substance Use Topics  . Smoking status: Never Smoker   . Smokeless tobacco: Never Used  . Alcohol Use: Yes     Comment: occasionally  employed Drinks 12-16 beers at one setting 3 times a month  Review of Systems  Constitutional: Negative for fever.  Gastrointestinal: Positive for vomiting. Negative for abdominal pain and diarrhea.  All other systems reviewed and are negative.  Allergies  Penicillins  Home Medications   Prior to Admission medications   Medication Sig Start Date End Date Taking? Authorizing Provider  naproxen (NAPROSYN) 500 MG tablet Take 1 tablet (500 mg total) by mouth 2 (two) times daily. Patient not taking: Reported on 02/26/2015 12/26/14   Janne Napoleon, NP  oxyCODONE-acetaminophen (PERCOCET) 7.5-325 MG per tablet Take 1-2 tablets by mouth every 4 (four) hours as needed. 02/26/15   Franky Macho, MD   BP 113/97 mmHg  Pulse 85  Resp 20  Ht 6' (1.829 m)  Wt 220 lb (99.791 kg)  BMI 29.83 kg/m2  SpO2 99%  Vital signs normal   Physical Exam  Constitutional: He is oriented to person, place, and time. He appears well-developed and well-nourished.  Non-toxic appearance. He does  not appear ill. No distress.  Pt appears to be intoxicated  HENT:  Head: Normocephalic and atraumatic.  Right Ear: External ear normal.  Left Ear: External ear normal.  Nose: Nose normal. No mucosal edema or rhinorrhea.  Mouth/Throat: Oropharynx is clear and moist and mucous membranes are normal. No dental abscesses or uvula swelling.  Eyes: Conjunctivae and EOM are normal. Pupils are equal, round, and reactive to light.  Neck: Normal range of motion and full passive range of  motion without pain. Neck supple.  Cardiovascular: Normal rate, regular rhythm and normal heart sounds.  Exam reveals no gallop and no friction rub.   No murmur heard. Pulmonary/Chest: Effort normal and breath sounds normal. No respiratory distress. He has no wheezes. He has no rhonchi. He has no rales. He exhibits no tenderness and no crepitus.  Abdominal: Soft. Normal appearance and bowel sounds are normal. He exhibits no distension. There is no tenderness. There is no rebound and no guarding.  Musculoskeletal: Normal range of motion. He exhibits no edema or tenderness.  Moves all extremities well.   Neurological: He is alert and oriented to person, place, and time. He has normal strength. No cranial nerve deficit.  Skin: Skin is warm, dry and intact. No rash noted. No erythema. No pallor.  Psychiatric: His speech is slurred. He is aggressive and slowed.  Slurred speech, pt is verbally abusive and hostile.   Nursing note and vitals reviewed.   ED Course  Procedures   Medications  0.9 %  sodium chloride infusion (0 mLs Intravenous Stopped 06/11/15 0101)    Followed by  0.9 %  sodium chloride infusion (1,000 mLs Intravenous New Bag/Given 06/11/15 0101)  ondansetron (ZOFRAN) injection 4 mg (4 mg Intravenous Given 06/11/15 0000)    DIAGNOSTIC STUDIES: Oxygen Saturation is 99% on RA, normal by my interpretation.    COORDINATION OF CARE: 11:25 PM Discussed treatment plan with pt at bedside and pt agreed to plan. Will order fluids and diagnostic labs.   12:41 AM Pt was sleeping on reevaluation. Discussed lab results with his fiance. She reports he normally drinks on the weekends. She again expressed she is concerned he has alcohol "poisoning". I have explained to her that means that he has consumed some much alcohol that he is unconscious or needs to be on a ventilator to support his respirations.  02:50 AM patient ambulatory in the ED without assistance. He has not had any vomiting in the  ED. PT feels ready for discharge.   Labs Review Results for orders placed or performed during the hospital encounter of 06/10/15  Comprehensive metabolic panel  Result Value Ref Range   Sodium 143 135 - 145 mmol/L   Potassium 3.9 3.5 - 5.1 mmol/L   Chloride 110 101 - 111 mmol/L   CO2 25 22 - 32 mmol/L   Glucose, Bld 104 (H) 65 - 99 mg/dL   BUN 9 6 - 20 mg/dL   Creatinine, Ser 2.13 0.61 - 1.24 mg/dL   Calcium 8.7 (L) 8.9 - 10.3 mg/dL   Total Protein 8.3 (H) 6.5 - 8.1 g/dL   Albumin 4.8 3.5 - 5.0 g/dL   AST 23 15 - 41 U/L   ALT 24 17 - 63 U/L   Alkaline Phosphatase 73 38 - 126 U/L   Total Bilirubin 0.3 0.3 - 1.2 mg/dL   GFR calc non Af Amer >60 >60 mL/min   GFR calc Af Amer >60 >60 mL/min   Anion gap 8 5 - 15  Ethanol  Result Value Ref Range   Alcohol, Ethyl (B) 240 (H) <5 mg/dL  CBC with Differential  Result Value Ref Range   WBC 6.5 4.0 - 10.5 K/uL   RBC 4.39 4.22 - 5.81 MIL/uL   Hemoglobin 13.4 13.0 - 17.0 g/dL   HCT 69.6 29.5 - 28.4 %   MCV 89.5 78.0 - 100.0 fL   MCH 30.5 26.0 - 34.0 pg   MCHC 34.1 30.0 - 36.0 g/dL   RDW 13.2 44.0 - 10.2 %   Platelets 230 150 - 400 K/uL   Neutrophils Relative % 48 %   Neutro Abs 3.1 1.7 - 7.7 K/uL   Lymphocytes Relative 46 %   Lymphs Abs 3.0 0.7 - 4.0 K/uL   Monocytes Relative 4 %   Monocytes Absolute 0.3 0.1 - 1.0 K/uL   Eosinophils Relative 2 %   Eosinophils Absolute 0.2 0.0 - 0.7 K/uL   Basophils Relative 0 %   Basophils Absolute 0.0 0.0 - 0.1 K/uL  Urinalysis, Routine w reflex microscopic  Result Value Ref Range   Color, Urine YELLOW YELLOW   APPearance CLEAR CLEAR   Specific Gravity, Urine 1.010 1.005 - 1.030   pH 5.5 5.0 - 8.0   Glucose, UA NEGATIVE NEGATIVE mg/dL   Hgb urine dipstick TRACE (A) NEGATIVE   Bilirubin Urine NEGATIVE NEGATIVE   Ketones, ur NEGATIVE NEGATIVE mg/dL   Protein, ur NEGATIVE NEGATIVE mg/dL   Urobilinogen, UA 0.2 0.0 - 1.0 mg/dL   Nitrite NEGATIVE NEGATIVE   Leukocytes, UA NEGATIVE NEGATIVE   Urine rapid drug screen (hosp performed)  Result Value Ref Range   Opiates NONE DETECTED NONE DETECTED   Cocaine NONE DETECTED NONE DETECTED   Benzodiazepines NONE DETECTED NONE DETECTED   Amphetamines NONE DETECTED NONE DETECTED   Tetrahydrocannabinol NONE DETECTED NONE DETECTED   Barbiturates NONE DETECTED NONE DETECTED  Urine microscopic-add on  Result Value Ref Range   Squamous Epithelial / LPF RARE RARE   WBC, UA 0-2 <3 WBC/hpf   RBC / HPF 0-2 <3 RBC/hpf   Bacteria, UA RARE RARE   Laboratory interpretation all normal except alcohol intoxication    I have personally reviewed and evaluated these  lab results as part of my medical decision-making.   MDM   Final diagnoses:  Alcohol intoxication, uncomplicated (HCC)  Nausea and vomiting, vomiting of unspecified type    Plan discharge  Devoria Albe, MD, FACEP   I personally performed the services described in this documentation, which was scribed in my presence. The recorded information has been reviewed and considered.  Devoria Albe, MD, Concha Pyo, MD 06/11/15 [phone]
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
## 2015-06-11 LAB — CBC WITH DIFFERENTIAL/PLATELET
Basophils Absolute: 0 10*3/uL (ref 0.0–0.1)
Basophils Relative: 0 %
EOS PCT: 2 %
Eosinophils Absolute: 0.2 10*3/uL (ref 0.0–0.7)
HEMATOCRIT: 39.3 % (ref 39.0–52.0)
Hemoglobin: 13.4 g/dL (ref 13.0–17.0)
LYMPHS ABS: 3 10*3/uL (ref 0.7–4.0)
LYMPHS PCT: 46 %
MCH: 30.5 pg (ref 26.0–34.0)
MCHC: 34.1 g/dL (ref 30.0–36.0)
MCV: 89.5 fL (ref 78.0–100.0)
MONO ABS: 0.3 10*3/uL (ref 0.1–1.0)
Monocytes Relative: 4 %
NEUTROS ABS: 3.1 10*3/uL (ref 1.7–7.7)
Neutrophils Relative %: 48 %
PLATELETS: 230 10*3/uL (ref 150–400)
RBC: 4.39 MIL/uL (ref 4.22–5.81)
RDW: 12.8 % (ref 11.5–15.5)
WBC: 6.5 10*3/uL (ref 4.0–10.5)

## 2015-06-11 LAB — RAPID URINE DRUG SCREEN, HOSP PERFORMED
Amphetamines: NOT DETECTED
Barbiturates: NOT DETECTED
Benzodiazepines: NOT DETECTED
Cocaine: NOT DETECTED
Opiates: NOT DETECTED
TETRAHYDROCANNABINOL: NOT DETECTED

## 2015-06-11 LAB — COMPREHENSIVE METABOLIC PANEL
ALT: 24 U/L (ref 17–63)
AST: 23 U/L (ref 15–41)
Albumin: 4.8 g/dL (ref 3.5–5.0)
Alkaline Phosphatase: 73 U/L (ref 38–126)
Anion gap: 8 (ref 5–15)
BUN: 9 mg/dL (ref 6–20)
CHLORIDE: 110 mmol/L (ref 101–111)
CO2: 25 mmol/L (ref 22–32)
Calcium: 8.7 mg/dL — ABNORMAL LOW (ref 8.9–10.3)
Creatinine, Ser: 0.74 mg/dL (ref 0.61–1.24)
GFR calc Af Amer: >60 mL/min (ref >60)
GFR calc non Af Amer: >60 mL/min (ref >60)
Glucose, Bld: 104 mg/dL — ABNORMAL HIGH (ref 65–99)
Potassium: 3.9 mmol/L (ref 3.5–5.1)
SODIUM: 143 mmol/L (ref 135–145)
Total Bilirubin: 0.3 mg/dL (ref 0.3–1.2)
Total Protein: 8.3 g/dL — ABNORMAL HIGH (ref 6.5–8.1)

## 2015-06-11 LAB — URINALYSIS, ROUTINE W REFLEX MICROSCOPIC
BILIRUBIN URINE: NEGATIVE
GLUCOSE, UA: NEGATIVE mg/dL
KETONES UR: NEGATIVE mg/dL
Leukocytes, UA: NEGATIVE
Nitrite: NEGATIVE
Protein, ur: NEGATIVE mg/dL
SPECIFIC GRAVITY, URINE: 1.01 (ref 1.005–1.030)
Urobilinogen, UA: 0.2 mg/dL (ref 0.0–1.0)
pH: 5.5 (ref 5.0–8.0)

## 2015-06-11 LAB — URINE MICROSCOPIC-ADD ON

## 2015-06-11 LAB — ETHANOL: Alcohol, Ethyl (B): 240 mg/dL — ABNORMAL HIGH (ref <5)

## 2015-06-11 NOTE — Discharge Instructions (Signed)
You should consider cutting down on the amount of alcohol you consume at one sitting. You were 3 times over the legal limit to drive if you had been driving tonight.    Alcohol Intoxication Alcohol intoxication occurs when you drink enough alcohol that it affects your ability to function. It can be mild or very severe. Drinking a lot of alcohol in a short time is called binge drinking. This can be very harmful. Drinking alcohol can also be more dangerous if you are taking medicines or other drugs. Some of the effects caused by alcohol may include:  Loss of coordination.  Changes in mood and behavior.  Unclear thinking.  Trouble talking (slurred speech).  Throwing up (vomiting).  Confusion.  Slowed breathing.  Twitching and shaking (seizures).  Loss of consciousness. HOME CARE  Do not drive after drinking alcohol.  Drink enough water and fluids to keep your pee (urine) clear or pale yellow. Avoid caffeine.  Only take medicine as told by your doctor. GET HELP IF:  You throw up (vomit) many times.  You do not feel better after a few days.  You frequently have alcohol intoxication. Your doctor can help decide if you should see a substance use treatment counselor. GET HELP RIGHT AWAY IF:  You become shaky when you stop drinking.  You have twitching and shaking.  You throw up blood. It may look bright red or like coffee grounds.  You notice blood in your poop (bowel movements).  You become lightheaded or pass out (faint). MAKE SURE YOU:   Understand these instructions.  Will watch your condition.  Will get help right away if you are not doing well or get worse.   This information is not intended to replace advice given to you by your health care provider. Make sure you discuss any questions you have with your health care provider.   Document Released: 02/04/2008 Document Revised: 04/20/2013 Document Reviewed: 01/21/2013 Elsevier Interactive Patient Education [employer].

## 2017-11-02 DIAGNOSIS — K649 Unspecified hemorrhoids: Secondary | ICD-10-CM | POA: Diagnosis not present

## 2017-11-16 DIAGNOSIS — J029 Acute pharyngitis, unspecified: Secondary | ICD-10-CM | POA: Diagnosis not present

## 2017-11-16 DIAGNOSIS — J019 Acute sinusitis, unspecified: Secondary | ICD-10-CM | POA: Diagnosis not present

## 2017-11-16 DIAGNOSIS — Z6829 Body mass index (BMI) 29.0-29.9, adult: Secondary | ICD-10-CM | POA: Diagnosis not present

## 2017-12-10 DIAGNOSIS — Z6829 Body mass index (BMI) 29.0-29.9, adult: Secondary | ICD-10-CM | POA: Diagnosis not present

## 2017-12-10 DIAGNOSIS — J019 Acute sinusitis, unspecified: Secondary | ICD-10-CM | POA: Diagnosis not present

## 2017-12-10 DIAGNOSIS — J029 Acute pharyngitis, unspecified: Secondary | ICD-10-CM | POA: Diagnosis not present

## 2017-12-10 DIAGNOSIS — K649 Unspecified hemorrhoids: Secondary | ICD-10-CM | POA: Diagnosis not present

## 2017-12-14 DIAGNOSIS — E782 Mixed hyperlipidemia: Secondary | ICD-10-CM | POA: Diagnosis not present

## 2017-12-14 DIAGNOSIS — Z6829 Body mass index (BMI) 29.0-29.9, adult: Secondary | ICD-10-CM | POA: Diagnosis not present

## 2017-12-14 DIAGNOSIS — Z0000 Encounter for general adult medical examination without abnormal findings: Secondary | ICD-10-CM | POA: Diagnosis not present

## 2017-12-29 DIAGNOSIS — Z6830 Body mass index (BMI) 30.0-30.9, adult: Secondary | ICD-10-CM | POA: Diagnosis not present

## 2017-12-29 DIAGNOSIS — S30860A Insect bite (nonvenomous) of lower back and pelvis, initial encounter: Secondary | ICD-10-CM | POA: Diagnosis not present

## 2018-02-18 DIAGNOSIS — J029 Acute pharyngitis, unspecified: Secondary | ICD-10-CM | POA: Diagnosis not present

## 2018-02-18 DIAGNOSIS — Z6829 Body mass index (BMI) 29.0-29.9, adult: Secondary | ICD-10-CM | POA: Diagnosis not present

## 2018-10-19 DIAGNOSIS — R197 Diarrhea, unspecified: Secondary | ICD-10-CM | POA: Diagnosis not present

## 2018-10-19 DIAGNOSIS — R110 Nausea: Secondary | ICD-10-CM | POA: Diagnosis not present

## 2018-10-19 DIAGNOSIS — K529 Noninfective gastroenteritis and colitis, unspecified: Secondary | ICD-10-CM | POA: Diagnosis not present

## 2018-10-19 DIAGNOSIS — R531 Weakness: Secondary | ICD-10-CM | POA: Diagnosis not present

## 2019-03-22 ENCOUNTER — Other Ambulatory Visit (HOSPITAL_COMMUNITY): Payer: Self-pay | Admitting: Internal Medicine

## 2019-03-22 ENCOUNTER — Other Ambulatory Visit: Payer: Self-pay | Admitting: Internal Medicine

## 2019-03-22 DIAGNOSIS — Z8249 Family history of ischemic heart disease and other diseases of the circulatory system: Secondary | ICD-10-CM | POA: Diagnosis not present

## 2019-03-22 DIAGNOSIS — Z6829 Body mass index (BMI) 29.0-29.9, adult: Secondary | ICD-10-CM | POA: Diagnosis not present

## 2019-03-22 DIAGNOSIS — E663 Overweight: Secondary | ICD-10-CM | POA: Diagnosis not present

## 2019-03-28 ENCOUNTER — Other Ambulatory Visit: Payer: Self-pay

## 2019-03-28 ENCOUNTER — Ambulatory Visit (HOSPITAL_COMMUNITY): Payer: MEDICAID

## 2019-03-28 ENCOUNTER — Ambulatory Visit (HOSPITAL_COMMUNITY)
Admission: RE | Admit: 2019-03-28 | Discharge: 2019-03-28 | Disposition: A | Discharge status: Home / Self Care | Payer: BC Managed Care – PPO | Source: Ambulatory Visit | Attending: Internal Medicine | Admitting: Internal Medicine

## 2019-03-28 ENCOUNTER — Other Ambulatory Visit (HOSPITAL_COMMUNITY): Payer: Self-pay | Admitting: Adult Health Nurse Practitioner

## 2019-03-28 DIAGNOSIS — Z8249 Family history of ischemic heart disease and other diseases of the circulatory system: Secondary | ICD-10-CM | POA: Diagnosis not present

## 2019-05-18 DIAGNOSIS — E782 Mixed hyperlipidemia: Secondary | ICD-10-CM | POA: Diagnosis not present

## 2019-05-25 DIAGNOSIS — M1A0710 Idiopathic chronic gout, right ankle and foot, without tophus (tophi): Secondary | ICD-10-CM | POA: Diagnosis not present

## 2019-05-25 DIAGNOSIS — L719 Rosacea, unspecified: Secondary | ICD-10-CM | POA: Diagnosis not present

## 2019-05-25 DIAGNOSIS — Z0001 Encounter for general adult medical examination with abnormal findings: Secondary | ICD-10-CM | POA: Diagnosis not present

## 2019-05-25 DIAGNOSIS — E782 Mixed hyperlipidemia: Secondary | ICD-10-CM | POA: Diagnosis not present

## 2019-08-30 DIAGNOSIS — M1A00X0 Idiopathic chronic gout, unspecified site, without tophus (tophi): Secondary | ICD-10-CM | POA: Diagnosis not present

## 2019-08-30 DIAGNOSIS — M1A0710 Idiopathic chronic gout, right ankle and foot, without tophus (tophi): Secondary | ICD-10-CM | POA: Diagnosis not present

## 2019-08-30 DIAGNOSIS — E782 Mixed hyperlipidemia: Secondary | ICD-10-CM | POA: Diagnosis not present

## 2019-11-12 IMAGING — US ULTRASOUND AORTA
1 series · 14 of 25 positions shown · non-contrast
Comparison: CT abdomen and pelvis February 26, 2015

CLINICAL DATA: Family history of abdominal aortic aneurysm.

EXAM:
ULTRASOUND OF ABDOMINAL AORTA
TECHNIQUE: Ultrasound examination of the abdominal aorta and proximal common
iliac arteries was performed to evaluate for aneurysm. Additional
color and Doppler images of the distal aorta were obtained to
document patency.

[Series 1: ultrasound aorta · 0.28mm/px · 14 of 27 slices shown]
[im 1/27]
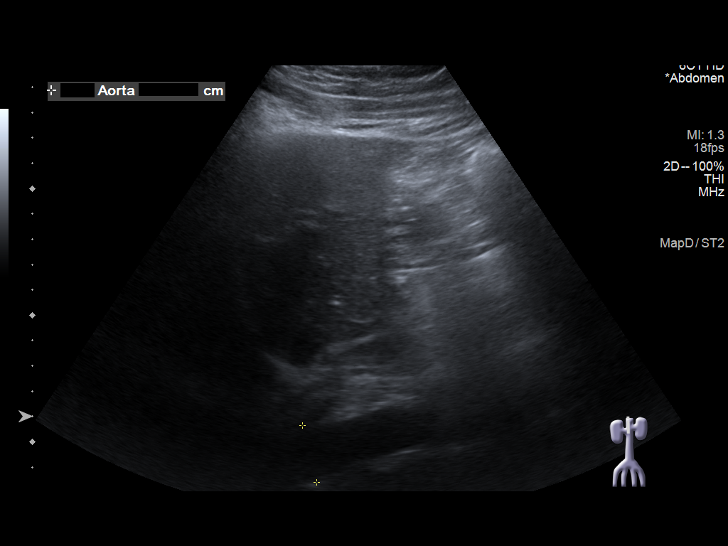
[im 3/27]
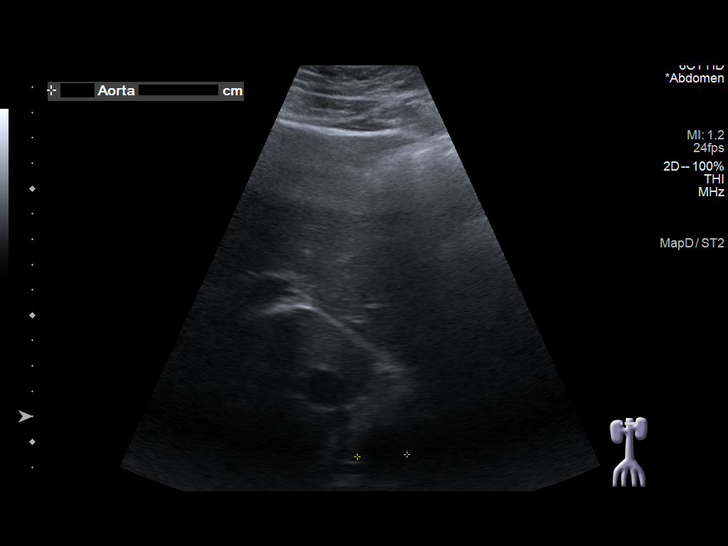
[im 5/27]
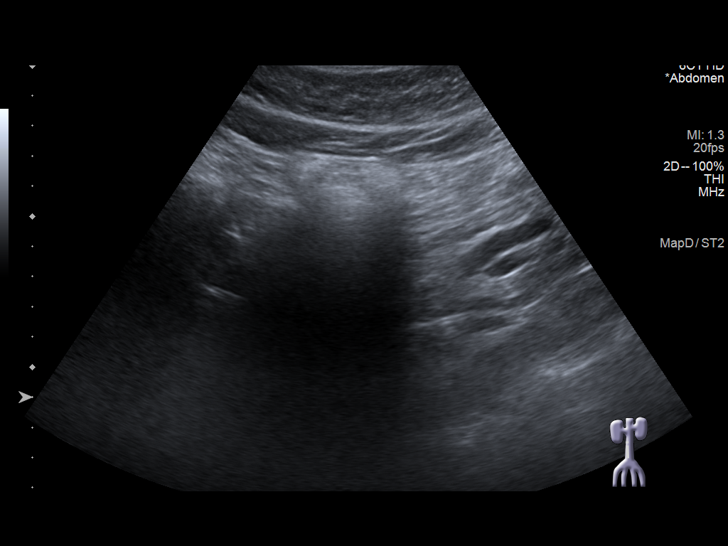
[im 7/27]
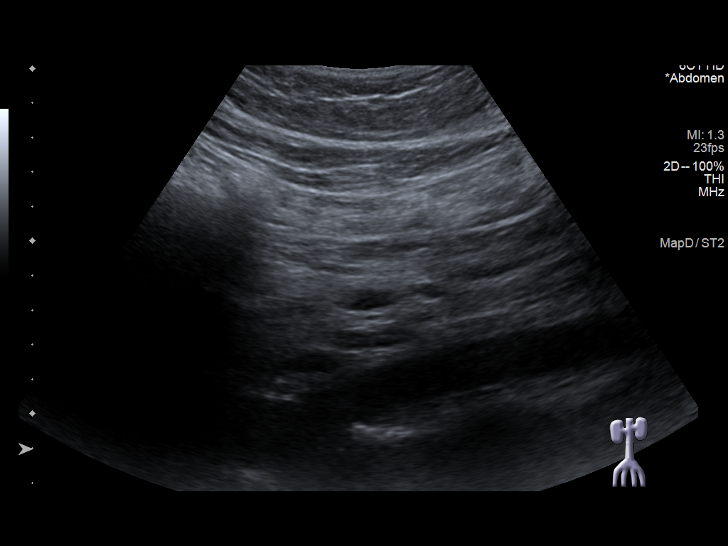
[im 9/27]
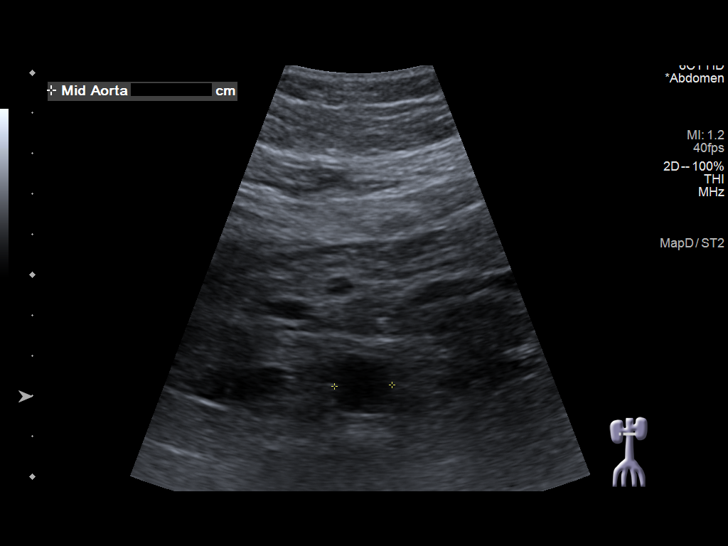
[im 10/27]
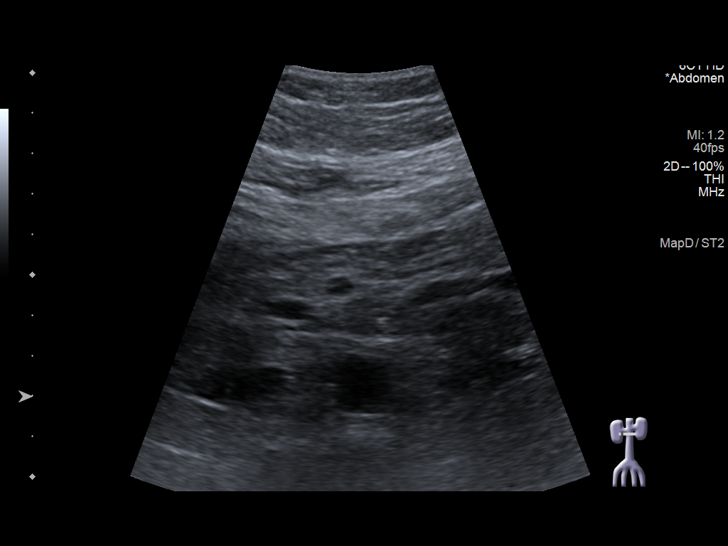
[im 12/27]
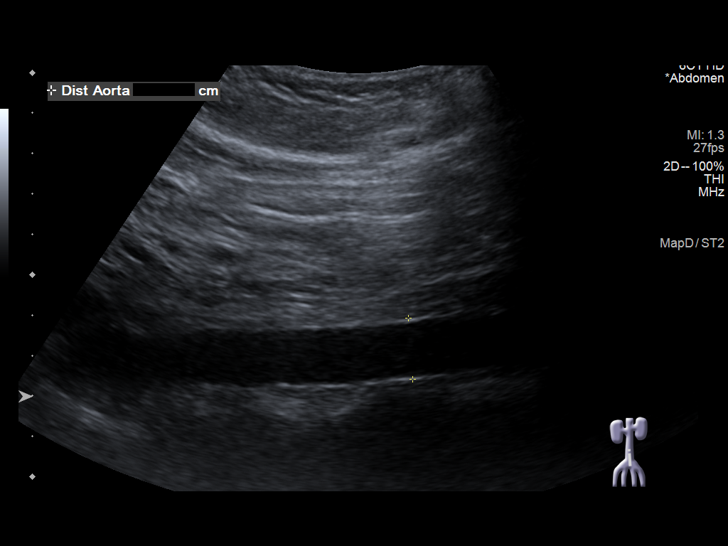
[im 15/27]
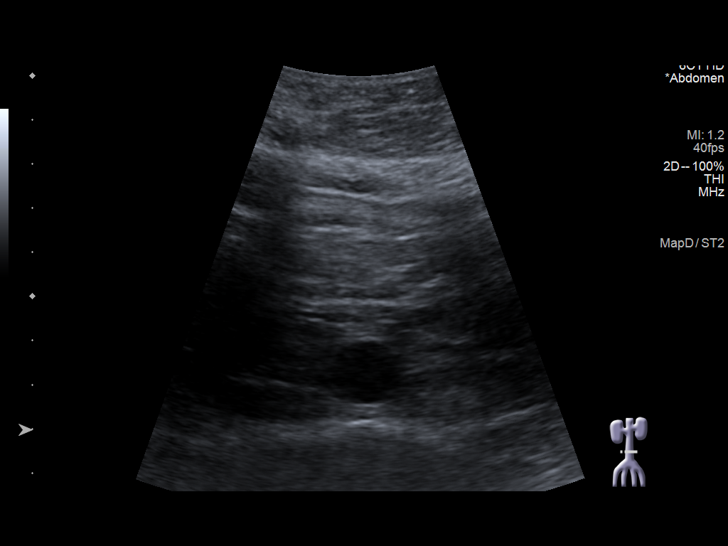
[im 17/27]
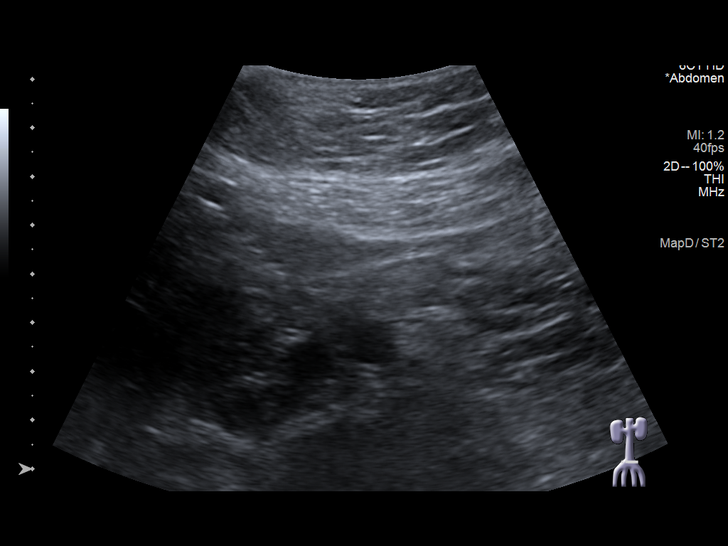
[im 18/27]
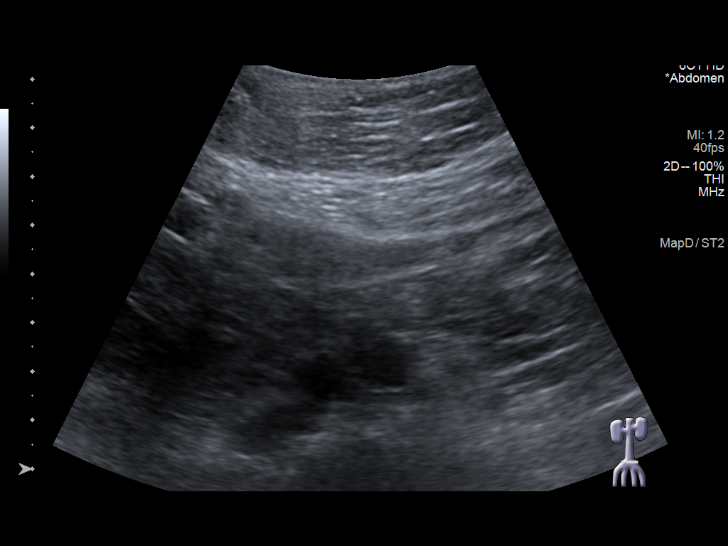
[im 20/27]
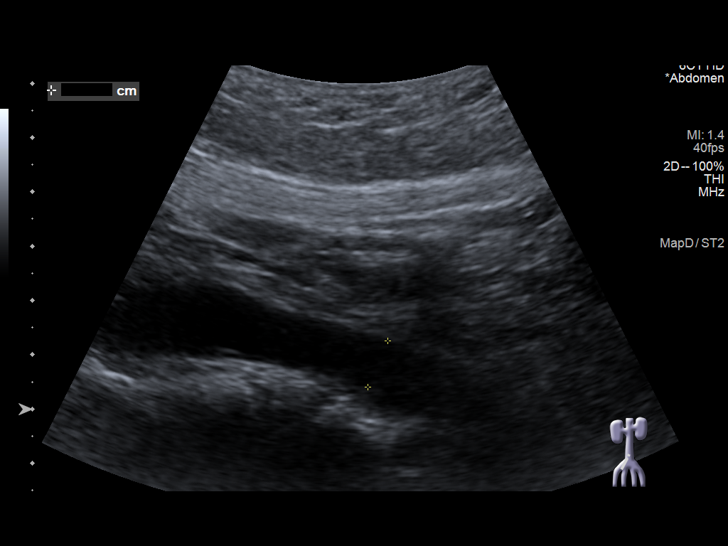
[im 22/27]
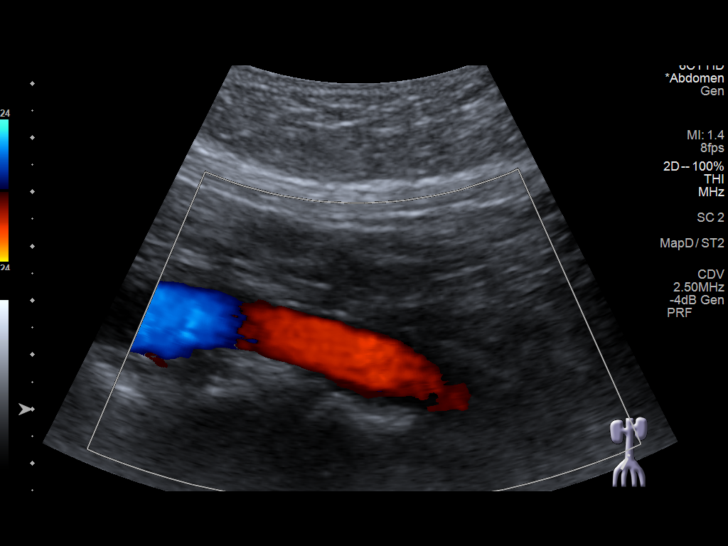
[im 24/27]
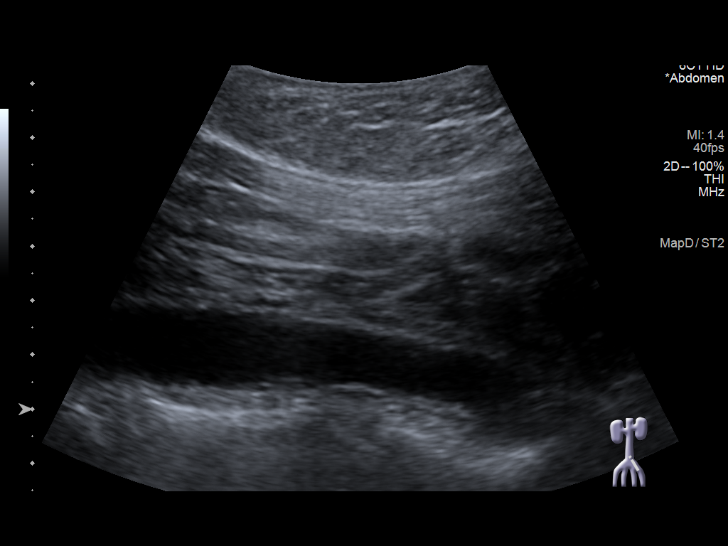
[im 27/27]
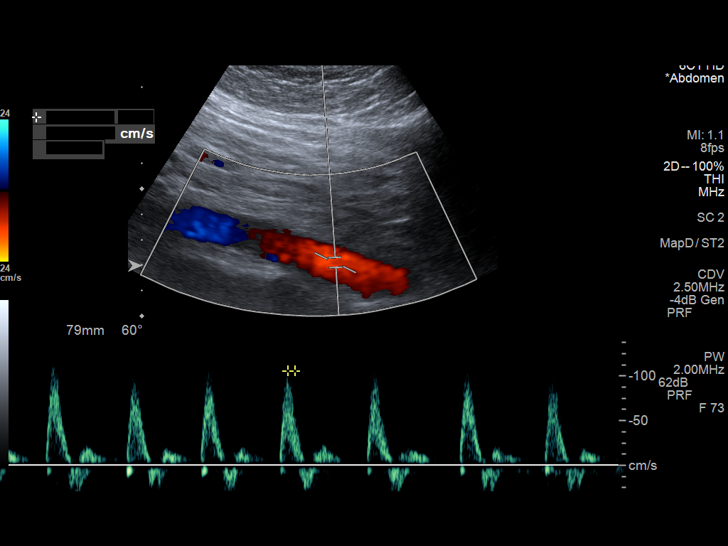

[14 of 25 positions shown; findings below may reference images not displayed]

FINDINGS: Abdominal aortic measurements as follows:

Proximal:  2.3 x 2.0 cm

Mid:  1.6 x 1.4 cm

Distal:  1.5 x 1.4 cm
Patent: Yes, peak systolic velocity is 106 cm/s

Right common iliac artery:   0.9 x 0.9 cm

Left common iliac artery: 1.1 x 1.0 cm

No periaortic fluid or adenopathy.
IMPRESSION: No demonstrable abdominal aortic aneurysm. No appreciable
atherosclerotic type change noted. Study otherwise unremarkable.

## 2021-05-21 ENCOUNTER — Other Ambulatory Visit: Payer: Self-pay | Admitting: Podiatry

## 2021-05-21 ENCOUNTER — Encounter: Payer: Self-pay | Admitting: Podiatry

## 2021-05-21 ENCOUNTER — Ambulatory Visit (INDEPENDENT_AMBULATORY_CARE_PROVIDER_SITE_OTHER): Payer: Medicaid Other

## 2021-05-21 ENCOUNTER — Ambulatory Visit (INDEPENDENT_AMBULATORY_CARE_PROVIDER_SITE_OTHER): Payer: Medicaid Other | Admitting: Podiatry

## 2021-05-21 ENCOUNTER — Other Ambulatory Visit: Payer: Self-pay

## 2021-05-21 DIAGNOSIS — M778 Other enthesopathies, not elsewhere classified: Secondary | ICD-10-CM | POA: Diagnosis not present

## 2021-05-21 DIAGNOSIS — M205X1 Other deformities of toe(s) (acquired), right foot: Secondary | ICD-10-CM | POA: Diagnosis not present

## 2021-05-21 MED ORDER — CELECOXIB 100 MG PO CAPS: 100.0000 mg | ORAL_CAPSULE | Freq: Two times a day (BID) | ORAL | 3 refills | Status: DC

## 2021-05-21 NOTE — Progress Notes (Signed)
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 male presents with the above complaint.   ROS: He denies fever chills nausea vomiting muscle aches pains calf pain back pain chest pain shortness of breath.  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   Deviated nasal septum 01/2013   Past Surgical History:  Procedure Laterality Date   APPENDECTOMY     LAPAROSCOPIC APPENDECTOMY N/A 02/26/2015   Procedure: APPENDECTOMY LAPAROSCOPIC;  Surgeon: Franky Macho, MD;  Location: [facility];  Service: General;  Laterality: N/A;   NASAL FRACTURE SURGERY  2007   NASAL RECONSTRUCTION N/A 02/07/2013   Procedure: TOTAL NASAL RECONSTRUCTION ;  Surgeon: Louisa Second, MD;  Location: [facility];  Service: Plastics;  Laterality: N/A;   NASAL SEPTUM SURGERY N/A 02/07/2013   Procedure: WITH SEPTOPLASTY;  Surgeon: Louisa Second, MD;  Location: [facility];  Service: Plastics;  Laterality: N/A;    Current Outpatient Medications:    celecoxib (CELEBREX) 100 MG capsule, Take 1 capsule (100 mg total) by mouth 2 (two) times daily., Disp: 60 capsule, Rfl: 3   meloxicam (MOBIC) 15 MG tablet, Take 15 mg by mouth daily as needed., Disp: , Rfl:   Allergies  Allergen Reactions   Penicillins Other (See Comments)    UNKNOWN - WAS AS A CHILD   Review of Systems Objective:  There were no vitals filed for this visit.  General: Well developed, nourished, in no acute distress, alert and oriented x3   Dermatological: Skin is warm, dry and supple bilateral. Nails x 10 are well maintained; remaining integument appears unremarkable at this time. There are no open sores, no preulcerative  lesions, no rash or signs of infection present.  Vascular: Dorsalis Pedis artery and Posterior Tibial artery pedal pulses are 2/4 bilateral with immedate capillary fill time. Pedal hair growth present. No varicosities and no lower extremity edema present bilateral.   Neruologic: Grossly intact via light touch bilateral. Vibratory intact via tuning fork bilateral. Protective threshold with Semmes Wienstein monofilament intact to all pedal sites bilateral. Patellar and Achilles deep tendon reflexes 2+ bilateral. No Babinski or clonus noted bilateral.   Musculoskeletal: No gross boney pedal deformities bilateral. No pain, crepitus, or limitation noted with foot and ankle range of motion bilateral. Muscular strength 5/5 in all groups tested bilateral.  Pain on dorsiflexion with limited range of motion and an elevated first metatarsal.  Spurring is palpable dorsally at the base of the proximal phalanx and distal first metatarsal.  Gait: Unassisted, Nonantalgic.    Radiographs:  Radiographs taken today demonstrate joint space narrowing of the first metatarsophalangeal joint some spurring eburnation subchondral sclerosis.  Also demonstrates an elevated first metatarsal and an elongated first metatarsal.  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  Elevated first metatarsal with an elongated first metatarsal.  Plan: Discussed etiology pathology conservative surgical therapies at this point changes meloxicam to Celebrex to see if this will work any better for him.  Offered him an injection he declined at this point.  We discussed appropriate shoe gear  with stiff sole.  Also discussed the inevitable need for surgical correction consisting of either a decompression osteotomy and fusion or arthroplasty with an implant.  I will follow-up with him on an as-needed basis.     Randall Colden T. [place], [place]

## 2021-08-30 ENCOUNTER — Other Ambulatory Visit: Payer: Self-pay

## 2021-08-30 ENCOUNTER — Ambulatory Visit
Admission: EM | Admit: 2021-08-30 | Discharge: 2021-08-30 | Disposition: A | Discharge status: Home / Self Care | Payer: Medicaid Other | Attending: Family Medicine | Admitting: Family Medicine

## 2021-08-30 ENCOUNTER — Encounter: Payer: Self-pay | Admitting: Emergency Medicine

## 2021-08-30 DIAGNOSIS — J020 Streptococcal pharyngitis: Secondary | ICD-10-CM | POA: Diagnosis not present

## 2021-08-30 LAB — POCT RAPID STREP A (OFFICE): Rapid Strep A Screen: POSITIVE — AB

## 2021-08-30 MED ORDER — AZITHROMYCIN 250 MG PO TABS: ORAL_TABLET | ORAL | 0 refills | Status: DC

## 2021-08-30 MED ORDER — LIDOCAINE VISCOUS HCL 2 % MT SOLN: 10.0000 mL | OROMUCOSAL | 0 refills | Status: DC | PRN

## 2021-08-30 NOTE — ED Provider Notes (Signed)
[facility]    CSN: [id number] Arrival date & time: 08/30/21  0848      History   Chief Complaint No chief complaint on file.   HPI Daniel Nunez is a 35 y.o. male.   Presenting today with 1 day history of sore, swollen throat, weakness, fatigue.  Denies known fever, chills, dysphagia, cough, congestion, abdominal pain, nausea vomiting or diarrhea.  Not trying anything over-the-counter for symptoms as far.  Numerous family members with strep throat at this time.   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   Deviated nasal septum 01/2013    Patient Active Problem List   Diagnosis Date Noted   Appendicitis, acute 02/26/2015   Appendicitis 02/26/2015    Past Surgical History:  Procedure Laterality Date   APPENDECTOMY     LAPAROSCOPIC APPENDECTOMY N/A 02/26/2015   Procedure: APPENDECTOMY LAPAROSCOPIC;  Surgeon: Franky Macho, MD;  Location: [facility];  Service: General;  Laterality: N/A;   NASAL FRACTURE SURGERY  2007   NASAL RECONSTRUCTION N/A 02/07/2013   Procedure: TOTAL NASAL RECONSTRUCTION ;  Surgeon: Louisa Second, MD;  Location: [facility];  Service: Plastics;  Laterality: N/A;   NASAL SEPTUM SURGERY N/A 02/07/2013   Procedure: WITH SEPTOPLASTY;  Surgeon: Louisa Second, MD;  Location:  [facility];  Service: Plastics;  Laterality: N/A;       Home Medications    Prior to Admission medications   Medication Sig Start Date End Date Taking? Authorizing Provider  azithromycin (ZITHROMAX) 250 MG tablet Take first 2 tablets together, then 1 every day until finished. 08/30/21  Yes Particia Nearing, PA-C  lidocaine (XYLOCAINE) 2 % solution Use as directed 10 mLs in the mouth or throat every 3 (three) hours as needed for mouth pain. 08/30/21  Yes Particia Nearing, PA-C  celecoxib (CELEBREX) 100 MG capsule Take 1 capsule (100 mg total) by mouth 2 (two) times daily. 05/21/21   Hyatt, Max T, DPM   meloxicam (MOBIC) 15 MG tablet Take 15 mg by mouth daily as needed. 04/22/21   [provider]    Family History History reviewed. No pertinent family history.  Social History Social History   Tobacco Use   Smoking status: Never   Smokeless tobacco: Never  Substance Use Topics   Alcohol use: Yes    Comment: occasionally   Drug use: No     Allergies   Penicillins   Review of Systems Review of Systems Per HPI  Physical Exam Triage Vital Signs ED Triage Vitals  Enc Vitals Group     BP 08/30/21 0933 135/78     Pulse Rate 08/30/21 0933 81     Resp 08/30/21 0933 18     Temp 08/30/21 0933 98.8 F (37.1 C)     Temp Source 08/30/21 0933 Oral     SpO2 08/30/21 0933 97 %     Weight --      Height --      Head Circumference --      Peak Flow --      Pain Score 08/30/21 0934 3     Pain Loc --      Pain Edu? --      Excl. in GC? --    No data found.  Updated Vital Signs BP 135/78 (BP Location: Right Arm)    Pulse 81    Temp 98.8 F (37.1 C) (Oral)    Resp 18    SpO2  97%   Visual Acuity Right Eye Distance:   Left Eye Distance:   Bilateral Distance:    Right Eye Near:   Left Eye Near:    Bilateral Near:     Physical Exam Vitals and nursing note reviewed.  Constitutional:      Appearance: He is well-developed.  HENT:     Head: Atraumatic.     Right Ear: External ear normal.     Left Ear: External ear normal.     Nose: Nose normal.     Mouth/Throat:     Pharynx: Oropharyngeal exudate and posterior oropharyngeal erythema present.     Comments: Uvula midline, oral airway patent Eyes:     Conjunctiva/sclera: Conjunctivae normal.     Pupils: Pupils are equal, round, and reactive to light.  Cardiovascular:     Rate and Rhythm: Normal rate and regular rhythm.  Pulmonary:     Effort: Pulmonary effort is normal. No respiratory distress.     Breath sounds: No wheezing or rales.  Musculoskeletal:        General: Normal range of motion.     Cervical  back: Normal range of motion and neck supple.  Lymphadenopathy:     Cervical: No cervical adenopathy.  Skin:    General: Skin is warm and dry.  Neurological:     Mental Status: He is alert and oriented to person, place, and time.  Psychiatric:        Behavior: Behavior normal.     UC Treatments / Results  Labs (all labs ordered are listed, but only abnormal results are displayed) Labs Reviewed  POCT RAPID STREP A (OFFICE) - Abnormal; Notable for the following components:      Result Value   Rapid Strep A Screen Positive (*)    All other components within normal limits    EKG   Radiology No results found.  Procedures Procedures (including critical care time)  Medications Ordered in UC Medications - No data to display  Initial Impression / Assessment and Plan / UC Course  I have reviewed the triage vital signs and the nursing notes.  Pertinent labs & imaging results that were available during my care of the patient were reviewed by me and considered in my medical decision making (see chart for details).   Vital signs reassuring, rapid strep positive.  Will treat with azithromycin, viscous lidocaine, supportive over-the-counter medications and home care.  Return for acutely worsening symptoms.  Work note given.  Final Clinical Impressions(s) / UC Diagnoses   Final diagnoses:  Strep pharyngitis   Discharge Instructions   None    ED Prescriptions     Medication Sig Dispense Auth. Provider   azithromycin (ZITHROMAX) 250 MG tablet Take first 2 tablets together, then 1 every day until finished. 6 tablet Particia Nearing, PA-C   lidocaine (XYLOCAINE) 2 % solution Use as directed 10 mLs in the mouth or throat every 3 (three) hours as needed for mouth pain. 100 mL Particia Nearing, [place]      PDMP not reviewed this encounter.   Roosvelt Maser [place], [place] 08/30/21 [phone]

## 2021-08-30 NOTE — ED Triage Notes (Signed)
Sore throat since yesterday.  Exposed to strep from wife and 2 other family members.  States he feels weak.

## 2021-10-09 ENCOUNTER — Ambulatory Visit
Admission: EM | Admit: 2021-10-09 | Discharge: 2021-10-09 | Disposition: A | Discharge status: Home / Self Care | Payer: Medicaid Other | Attending: Family Medicine | Admitting: Family Medicine

## 2021-10-09 ENCOUNTER — Other Ambulatory Visit: Payer: Self-pay

## 2021-10-09 DIAGNOSIS — J069 Acute upper respiratory infection, unspecified: Secondary | ICD-10-CM

## 2021-10-09 DIAGNOSIS — J029 Acute pharyngitis, unspecified: Secondary | ICD-10-CM | POA: Diagnosis not present

## 2021-10-09 LAB — POCT RAPID STREP A (OFFICE): Rapid Strep A Screen: NEGATIVE

## 2021-10-09 MED ORDER — PROMETHAZINE-DM 6.25-15 MG/5ML PO SYRP: 5.0000 mL | ORAL_SOLUTION | Freq: Four times a day (QID) | ORAL | 0 refills | Status: DC | PRN

## 2021-10-09 NOTE — ED Triage Notes (Signed)
Pt reports body aches, sore throat and headache x 1 day

## 2021-10-09 NOTE — ED Provider Notes (Signed)
[facility] CARE CENTER   [id number] 10/09/21 Arrival Time: 8786  ASSESSMENT & PLAN:  1. Viral URI with cough   2. Sore throat    Rapid strep negative. Culture sent. COVID/flu testing sent. Discussed typical duration of viral illnesses. OTC symptom care as needed.  As needed: Discharge Medication List as of 10/09/2021  9:48 AM     START taking these medications   Details  promethazine-dextromethorphan (PROMETHAZINE-DM) 6.25-15 MG/5ML syrup Take 5 mLs by mouth 4 (four) times daily as needed for cough., Starting Wed 10/09/2021, Normal       Work note provided.   Follow-up Information     Benita Stabile, MD.   Specialty: Internal Medicine Why: If worsening or failing to improve as anticipated. Contact information: [address] [place] [facility] [address] [phone]                 Reviewed expectations re: course of current medical issues. Questions answered. Outlined signs and symptoms indicating need for more acute intervention. Understanding verbalized. After Visit Summary given.   SUBJECTIVE: History from: Patient. Daniel Nunez is a 36 y.o. male. Reports: body aches, ST, HA. Abrupt onset; x 1 day. Denies: fever and difficulty breathing. Normal PO intake without n/v/d.  OBJECTIVE:  Vitals:   10/09/21 0902  BP: 122/77  Pulse: 85  Resp: 16  Temp: 98.4 F (36.9 C)  TempSrc: Oral  SpO2: 98%    General appearance: alert; no distress Eyes: PERRLA; EOMI; conjunctiva normal HENT: [place]; AT; with nasal congestion; throat with mild and diffuse erythema Neck: supple  Lungs: speaks full sentences without difficulty; unlabored Extremities: no edema Skin: warm and dry Neurologic: normal gait Psychological: alert and cooperative; normal mood and affect  Labs: Results for orders placed or performed during the hospital encounter of 10/09/21  POCT rapid strep A  Result Value Ref Range   Rapid Strep A Screen Negative Negative   Labs Reviewed  CULTURE,  GROUP A STREP ([facility])  COVID-19, FLU A+B NAA  POCT RAPID STREP A (OFFICE)    Allergies  Allergen Reactions   Penicillins Other (See Comments)    UNKNOWN - WAS AS A CHILD    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   Deviated nasal septum 01/2013   Social History   Socioeconomic History   Marital status: Single    Spouse name: Not on file   Number of children: Not on file   Years of education: Not on file   Highest education level: Not on file  Occupational History   Not on file  Tobacco Use   Smoking status: Never   Smokeless tobacco: Never  Substance and Sexual Activity   Alcohol use: Yes    Comment: occasionally   Drug use: No   Sexual activity: Yes  Other Topics Concern   Not on file  Social History Narrative   Not on file   Social Determinants of Health   Financial Resource Strain: Not on file  Food Insecurity: Not on file  Transportation Needs: Not on file  Physical Activity: Not on file  Stress: Not on file  Social Connections: Not on file  Intimate Partner Violence: Not on file   History reviewed. No pertinent family history. Past Surgical History:  Procedure Laterality Date   APPENDECTOMY     LAPAROSCOPIC APPENDECTOMY N/A 02/26/2015   Procedure: APPENDECTOMY LAPAROSCOPIC;  Surgeon: Franky Macho, MD;  Location: [facility];  Service: General;  Laterality: N/A;  NASAL FRACTURE SURGERY  2007   NASAL RECONSTRUCTION N/A 02/07/2013   Procedure: TOTAL NASAL RECONSTRUCTION ;  Surgeon: Louisa Second, MD;  Location: [facility];  Service: Plastics;  Laterality: N/A;   NASAL SEPTUM SURGERY N/A 02/07/2013   Procedure: WITH SEPTOPLASTY;  Surgeon: Louisa Second, MD;  Location: [facility];  Service: Plastics;  Laterality: N/AMardella Layman, MD 10/09/21 1005

## 2021-10-09 NOTE — Discharge Instructions (Addendum)
You have been tested for COVID-19/influenza today. °If your test returns positive, you will receive a phone call from [place] regarding your results. °Negative test results are not called. °Both positive and negative results area always visible on MyChart. °If you do not have a MyChart account, sign up instructions are provided in your discharge papers. °Please do not hesitate to contact us should you have questions or concerns. ° °You may use over the counter ibuprofen or acetaminophen as needed.  °For a sore throat, over the counter products such as [employer] Peroxyl Mouth Sore Rinse or Chloraseptic Sore Throat Spray may provide some temporary relief. °Your rapid strep test was negative today. We have sent your throat swab for culture and will let you know of any positive results. ° ° ° °

## 2021-10-10 LAB — COVID-19, FLU A+B NAA
Influenza A, NAA: NOT DETECTED
Influenza B, NAA: NOT DETECTED
SARS-CoV-2, NAA: NOT DETECTED

## 2021-10-11 ENCOUNTER — Telehealth (HOSPITAL_COMMUNITY): Payer: Self-pay | Admitting: Emergency Medicine

## 2021-10-11 LAB — CULTURE, GROUP A STREP (THRC)

## 2021-10-11 MED ORDER — AZITHROMYCIN 250 MG PO TABS: 250.0000 mg | ORAL_TABLET | Freq: Every day | ORAL | 0 refills | Status: DC

## 2022-01-09 DIAGNOSIS — E782 Mixed hyperlipidemia: Secondary | ICD-10-CM | POA: Diagnosis not present

## 2022-02-10 NOTE — Progress Notes (Signed)
H&P  Chief Complaint: Vasectomy consult  History of Present Illness: 36 year old male comes in today with his wife, Aundra Millet.  They have 4 children together.  They desire permanent sterilization.  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   Deviated nasal septum 01/2013    Past Surgical History:  Procedure Laterality Date   APPENDECTOMY     LAPAROSCOPIC APPENDECTOMY N/A 02/26/2015   Procedure: APPENDECTOMY LAPAROSCOPIC;  Surgeon: Franky Macho, MD;  Location: [facility];  Service: General;  Laterality: N/A;   NASAL FRACTURE SURGERY  2007   NASAL RECONSTRUCTION N/A 02/07/2013   Procedure: TOTAL NASAL RECONSTRUCTION ;  Surgeon: Louisa Second, MD;  Location: [facility];  Service: Plastics;  Laterality: N/A;   NASAL SEPTUM SURGERY N/A 02/07/2013   Procedure: WITH SEPTOPLASTY;  Surgeon: Louisa Second, MD;  Location: [facility];  Service: Plastics;  Laterality: N/A;    Home Medications:  Allergies as of 02/11/2022       Reactions   Penicillins Other (See Comments)   UNKNOWN - WAS AS A CHILD        Medication List        Accurate as of February 10, 2022  9:06 AM. If you have any questions, ask your nurse or doctor.          azithromycin 250 MG tablet Commonly known as: ZITHROMAX Take 1 tablet (250 mg total) by mouth daily. Take first 2 tablets together, then 1 every day until finished.   celecoxib 100 MG capsule Commonly known as: CeleBREX Take 1 capsule (100 mg total) by mouth 2 (two) times daily.   lidocaine 2 % solution Commonly known as: XYLOCAINE Use as directed 10 mLs in the mouth or throat every 3 (three) hours as needed for mouth pain.   meloxicam 15 MG tablet Commonly known as: MOBIC Take 15 mg by mouth daily as needed.   promethazine-dextromethorphan 6.25-15 MG/5ML syrup Commonly known as: PROMETHAZINE-DM Take 5 mLs by mouth 4 (four) times daily as needed for cough.         Allergies:  Allergies  Allergen Reactions   Penicillins Other (See Comments)    UNKNOWN - WAS AS A CHILD    No family history on file.  Social History:  reports that he has never smoked. He has never used smokeless tobacco. He reports current alcohol use. He reports that he does not use drugs.  ROS: A complete review of systems was performed.  All systems are negative except for pertinent findings as noted.  Physical Exam:  Vital signs in last 24 hours: There were no vitals taken for this visit. Constitutional:  Alert and oriented, No acute distress Cardiovascular: Regular rate  Respiratory: Normal respiratory effort Genitourinary: Normal male phallus, testes are descended bilaterally and non-tender and without masses, scrotum is normal in appearance without lesions or masses, perineum is normal on inspection.  Vasa palpable bilaterally. Lymphatic: No lymphadenopathy Neurologic: Grossly intact, no focal deficits Psychiatric: Normal mood and affect  I have reviewed prior pt notes  I have reviewed notes from referring/previous physicians    Impression/Assessment:  Desire for sterility  Plan:  1. We discussed the procedure in detail, and I provided a vasectomy information packet.  He understands that this is a permanent form of sterilization. 2.  We discussed that vasectomy does not produce immediate infertility and he will need his semen sample showing no sperm before he  can stop using any other form of contraception.  This typically takes 3 months. 3.  We discussed that there is a < 1/100 failure rate. 4.  I advised him to refrain from ejaculating for approximately 1 week. 5.  There is less than a 1% chance of developing a scrotal hematoma and equal chance of developing chronic scrotal pain. 6.   We will schedule his vasectomy at his convenience.

## 2022-02-11 ENCOUNTER — Ambulatory Visit: Payer: Medicaid Other | Admitting: Urology

## 2022-02-11 DIAGNOSIS — Z3009 Encounter for other general counseling and advice on contraception: Secondary | ICD-10-CM | POA: Diagnosis not present

## 2022-02-11 MED ORDER — DIAZEPAM 10 MG PO TABS: ORAL_TABLET | ORAL | 0 refills | Status: DC

## 2022-03-11 ENCOUNTER — Encounter: Payer: Medicaid Other | Admitting: Urology

## 2022-03-18 ENCOUNTER — Encounter: Payer: Medicaid Other | Admitting: Urology

## 2022-03-22 ENCOUNTER — Encounter (HOSPITAL_COMMUNITY): Payer: Self-pay

## 2022-03-22 ENCOUNTER — Emergency Department (HOSPITAL_COMMUNITY)
Admission: EM | Admit: 2022-03-22 | Discharge: 2022-03-22 | Disposition: A | Discharge status: Home / Self Care | Payer: Medicaid Other | Attending: Emergency Medicine | Admitting: Emergency Medicine

## 2022-03-22 ENCOUNTER — Other Ambulatory Visit: Payer: Self-pay

## 2022-03-22 DIAGNOSIS — S0990XA Unspecified injury of head, initial encounter: Secondary | ICD-10-CM | POA: Diagnosis present

## 2022-03-22 DIAGNOSIS — S0101XA Laceration without foreign body of scalp, initial encounter: Secondary | ICD-10-CM | POA: Insufficient documentation

## 2022-03-22 DIAGNOSIS — S40011A Contusion of right shoulder, initial encounter: Secondary | ICD-10-CM | POA: Insufficient documentation

## 2022-03-22 DIAGNOSIS — Z23 Encounter for immunization: Secondary | ICD-10-CM | POA: Insufficient documentation

## 2022-03-22 MED ORDER — TETANUS-DIPHTH-ACELL PERTUSSIS 5-2.5-18.5 LF-MCG/0.5 IM SUSY
0.5000 mL | PREFILLED_SYRINGE | Freq: Once | INTRAMUSCULAR | Status: AC
  Administered 2022-03-22: 0.5 mL via INTRAMUSCULAR
  Filled 2022-03-22: qty 0.5

## 2022-03-22 MED ORDER — MELOXICAM 7.5 MG PO TABS: 7.5000 mg | ORAL_TABLET | Freq: Two times a day (BID) | ORAL | 0 refills | Status: AC | PRN

## 2022-03-22 NOTE — Discharge Instructions (Signed)
IYou have 3 staples in your scalp holding the cut together.  Take Mobic as prescribed below and apply topical antibiotic twice a day.  It is okay to shower, please do not go swimming, the staples will need to come out within 10 days at your family doctor's office or urgent care.  Return to the ER for severe worsening symptoms.  Please take Mobic, 7.5 mg by mouth twice daily as needed for pain - this in an antiinflammatory medicine (NSAID) and is similar to ibuprofen - many people feel that it is stronger than ibuprofen and it is easier to take since it is a smaller pill.  Please use this only for 1 week - if your pain persists, you will need to follow up with your doctor in the office for ongoing guidance and pain control.  Thank you for allowing [place] to treat you in the emergency department today.  After reviewing your examination and potential testing that was done it appears that you are safe to go home.  I would like for you to follow-up with your doctor within the next several days, have them obtain your results and follow-up with them to review all of these tests.  If you should develop severe or worsening symptoms return to the emergency department immediately
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
## 2022-04-22 ENCOUNTER — Ambulatory Visit (INDEPENDENT_AMBULATORY_CARE_PROVIDER_SITE_OTHER): Payer: Medicaid Other | Admitting: Urology

## 2022-04-22 VITALS — BP 138/76 | HR 63

## 2022-04-22 DIAGNOSIS — Z9852 Vasectomy status: Secondary | ICD-10-CM

## 2022-04-22 DIAGNOSIS — Z302 Encounter for sterilization: Secondary | ICD-10-CM

## 2022-04-22 NOTE — Progress Notes (Signed)
The patient has reviewed information regarding the risks and complications and understands the advantages and disadvantages of elective sterilization. With full informed written and oral consent, he has elected to proceed with vasectomy. The patient was placed in the supine position, and the genitalia was sterilely prepped with betadine and draped; 1% lidocaine was used for local anesthesia of the skin and perivasal tissue in the midline. The right vas was then grasped through the skin using the non-piercing vas clamp, the skin was pierced with a single blade of the sharpened hemostat. The sharpened hemostat was then placed through this incision, and the perivasal tissue cleared from around the vas itself. The vas was then delivered through the wound and clamped with a vas clamp, a segment of approximately 1 cm of vasal tissue was cleared, and a 2-0 chromic suture was then passed beneath the vas. The chromic suture was then tied proximally, and a second one was tied distally, isolating the approximately 1 cm segment of vas. I then excised the segment of vas and cauterized the proximal and distal lumens with the eye cautery. The excess suture was then cut, and the ligated ends were covered with dartos fascia and buried, and the vas was allowed to drop back into the scrotum after observation for any bleeding. The contralateral vas was then treated in an identical fashion. Any further bleeding points were cauterized. The wound was then observed for any bleeding, the skin edges compressed, and none was noted. The patient tolerated the procedure well and without complications.   A sterile gauze dressing was applied as well as a form of scrotal supporting clothing. He was sent home with instructions regarding wound care and signs of infection, and recommended to restrict activity for 48 hours. .  He was instructed to use ice for the next 12 hours.  He was also instructed to call immediately for excessive bleeding,  swelling, drainage, discomfort, fever or chills. He was informed he should not consider himself sterile until a  semen analysis at 12 weeks has  been noted to be completely free of sperm.  

## 2022-04-22 NOTE — Patient Instructions (Signed)
Vasectomy Postoperative Instructions ? ?Please bring back a semen analysis in approximately 3 months.  ?Your semen analysis  will need to be taken to  ?[facility]  [address], [place], [place] [address] ?[phone] ? ? You will be given a sterile specimen cup. Please label the cup with your name, date of birth, date and time of collections.  ?What to Expect ? - slight redness, swelling and scant drainage along the incision ? - mild to moderate discomfort ? - black and blue (bruising) as the tissue heals ? - low grade fever ? - scrotal sensitivity and/or tenderness ?- Edges of the incision may pull apart and heal slowly, sometimes a knot may be present which remains for several months.  This is NORMAL and all part of the healing process. ?- if stitches are placed, they do not need to be removed ?- if you have pain or discomfort immediately after the vasectomy, you may use OTC pain medication for relief , ex: tylenol.  After local anesthetic wears off an ice pack will provide additional comfort and can also prevent swelling if used ? ?Activity ? - no sexual intercourse for at lease 5 days depending on comfort ? - no heavy lifting for 48-72 hours (anything over 5-10 lbs) ? ?Wound Care ? - shower only after 24 hours ? - no tub baths, hot tub, or pools for at least 7 days ? - ice packs for 48 hours: 30 minutes on and 30 minutes off ? ?Problem to Report ? - generalized redness ? - increased pain and swelling ? - fever greater than 101 F ? - significant drainage or bleeding from the wound ? ?TO DO ?- Ejaculations help to clear the passage of sperm, but you must use another from of birth control until you are told you may discontinue its use!! ?- You will be given a specimen cup to bring back a semen sample in 3 months to check and see if its clear of sperm.  Only after the semen is sent for analysis and is reported back as clear should you use this as your primary form of birth control!    ?

## 2022-04-30 ENCOUNTER — Telehealth: Payer: Self-pay

## 2022-04-30 NOTE — Telephone Encounter (Signed)
Patient called office today to questions his post vasectomy symptoms.  Reports swelling to left side. Pain at times in suprapubic area   Appt offered with PA this week.  Patient would like to wait. Message sent to MD

## 2022-05-06 ENCOUNTER — Ambulatory Visit: Payer: Medicaid Other | Admitting: Urology

## 2022-05-06 VITALS — BP 130/83 | HR 66

## 2022-05-06 DIAGNOSIS — N50812 Left testicular pain: Secondary | ICD-10-CM | POA: Diagnosis not present

## 2022-05-06 DIAGNOSIS — N5089 Other specified disorders of the male genital organs: Secondary | ICD-10-CM | POA: Diagnosis not present

## 2022-05-06 DIAGNOSIS — Z9852 Vasectomy status: Secondary | ICD-10-CM

## 2022-05-06 LAB — URINALYSIS, ROUTINE W REFLEX MICROSCOPIC
Bilirubin, UA: NEGATIVE
Glucose, UA: NEGATIVE
Ketones, UA: NEGATIVE
Leukocytes,UA: NEGATIVE
Nitrite, UA: NEGATIVE
Protein,UA: NEGATIVE
RBC, UA: NEGATIVE
Specific Gravity, UA: 1.02 (ref 1.005–1.030)
Urobilinogen, Ur: 0.2 mg/dL (ref 0.2–1.0)
pH, UA: 5.5 (ref 5.0–7.5)

## 2022-05-06 MED ORDER — SULFAMETHOXAZOLE-TRIMETHOPRIM 800-160 MG PO TABS: 1.0000 | ORAL_TABLET | Freq: Two times a day (BID) | ORAL | 0 refills | Status: AC

## 2022-05-06 NOTE — Progress Notes (Signed)
   History of Present Illness: Patient had a few days after the procedure he began having left-sided testicular pain and some swelling.  This got worse a day or 2 after intercourse 2 days ago.  From his puncture site, no redness the discomfort is radiating into his left inguinal region.  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   Deviated nasal septum 01/2013    Past Surgical History:  Procedure Laterality Date   APPENDECTOMY     LAPAROSCOPIC APPENDECTOMY N/A 02/26/2015   Procedure: APPENDECTOMY LAPAROSCOPIC;  Surgeon: Franky Macho, MD;  Location: [facility];  Service: General;  Laterality: N/A;   NASAL FRACTURE SURGERY  2007   NASAL RECONSTRUCTION N/A 02/07/2013   Procedure: TOTAL NASAL RECONSTRUCTION ;  Surgeon: Louisa Second, MD;  Location: [facility];  Service: Plastics;  Laterality: N/A;   NASAL SEPTUM SURGERY N/A 02/07/2013   Procedure: WITH SEPTOPLASTY;  Surgeon: Louisa Second, MD;  Location: [facility];  Service: Plastics;  Laterality: N/A;    Home Medications:  Allergies as of 05/06/2022       Reactions   Penicillins Other (See Comments)   UNKNOWN - WAS AS A CHILD        Medication List        Accurate as of May 06, 2022 11:07 AM. If you have any questions, ask your nurse or doctor.          sulfamethoxazole-trimethoprim 800-160 MG tablet Commonly known as: BACTRIM DS Take 1 tablet by mouth 2 (two) times daily.        Allergies:  Allergies  Allergen Reactions   Penicillins Other (See Comments)    UNKNOWN - WAS AS A CHILD    No family history on file.  Social History:  reports that he has never smoked. He has never used smokeless tobacco. He reports current alcohol use. He reports that he does not use drugs.  ROS: A complete review of systems was performed.  All systems are negative except for pertinent findings as noted.  Physical Exam:  Vital signs in last 24 hours: BP 130/83   Pulse 66   Constitutional:  Alert and oriented, No acute distress Cardiovascular: Regular rate  Respiratory: Normal respiratory effort GI: Abdomen is soft, nontender, nondistended, no abdominal masses. No CVAT.  Genitourinary: Scrotal skin appears normal.  Puncture site well-healed. Swelling along Lt cord superior to testicle. No fluctuance. Tender. Lymphatic: No lymphadenopathy Neurologic: Grossly intact, no focal deficits Psychiatric: Normal mood and affect    Impression/Assessment:  Probable lt sided hematoma post vas  Plan:  Given 5 days of Bactrim 2. Hot baths nightly 3. Ibuprofen 600 mg tid 4. Followup w/ me in 3 weeks. If not better next week call back--will consider corticosteroids

## 2022-05-27 ENCOUNTER — Ambulatory Visit (INDEPENDENT_AMBULATORY_CARE_PROVIDER_SITE_OTHER): Payer: Medicaid Other | Admitting: Urology

## 2022-05-27 VITALS — BP 132/89 | HR 74 | Ht 72.0 in | Wt 220.0 lb

## 2022-05-27 DIAGNOSIS — Z9852 Vasectomy status: Secondary | ICD-10-CM

## 2022-05-27 NOTE — Progress Notes (Signed)
History of Present Illness: Daniel Nunez is here today for follow-up left-sided scrotal swelling following vasectomy.  At his visit here 3 weeks ago he was told to take ibuprofen and given a short course of antibiotics.  Since that time his left-sided scrotal swelling has improved.  He is worried as he feels a small lump on his right side.  This is minimally tender.  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   Deviated nasal septum 01/2013    Past Surgical History:  Procedure Laterality Date   APPENDECTOMY     LAPAROSCOPIC APPENDECTOMY N/A 02/26/2015   Procedure: APPENDECTOMY LAPAROSCOPIC;  Surgeon: Aviva Signs, MD;  Location: [facility];  Service: General;  Laterality: N/A;   NASAL FRACTURE SURGERY  2007   NASAL RECONSTRUCTION N/A 02/07/2013   Procedure: TOTAL NASAL RECONSTRUCTION ;  Surgeon: Cristine Polio, MD;  Location: [place];  Service: Plastics;  Laterality: N/A;   NASAL SEPTUM SURGERY N/A 02/07/2013   Procedure: WITH SEPTOPLASTY;  Surgeon: Cristine Polio, MD;  Location: [place];  Service: Plastics;  Laterality: N/A;    Home Medications:  Allergies as of 05/27/2022       Reactions   Penicillins Other (See Comments)   UNKNOWN - WAS AS A CHILD        Medication List        Accurate as of May 27, 2022  8:43 AM. If you have any questions, ask your nurse or doctor.          sulfamethoxazole-trimethoprim 800-160 MG tablet Commonly known as: BACTRIM DS Take 1 tablet by mouth 2 (two) times daily.        Allergies:  Allergies  Allergen Reactions   Penicillins Other (See Comments)    UNKNOWN - WAS AS A CHILD    No family history on file.  Social History:  reports that he has never smoked. He has never used smokeless tobacco. He reports current alcohol use. He reports that he does not use drugs.  ROS: A complete review of systems was performed.  All systems are negative except for pertinent findings as  noted.  Physical Exam:  Vital signs in last 24 hours: There were no vitals taken for this visit. Constitutional:  Alert and oriented, No acute distress Cardiovascular: Regular rate  Respiratory: Normal respiratory effort Genitourinary: Scrotal skin is normal.  Epididymal structures are normal.  Expected symmetrical nodularity of each vas deferens consistent with prior vasectomy..  There is no tenderness. Lymphatic: No lymphadenopathy Neurologic: Grossly intact, no focal deficits Psychiatric: Normal mood and affect  I have reviewed prior pt notes   Impression/Assessment:  Status post vasectomy with probable hematoma on the left side, resolved  Otherwise, normal postoperative exam  Plan:  1.  Patient was reassured.  He can still take anti-inflammatories as necessary  2.  He has an appointment in late November for his semen analysis.  Until then, continue birth control

## 2022-05-28 LAB — URINALYSIS, ROUTINE W REFLEX MICROSCOPIC
Bilirubin, UA: NEGATIVE
Glucose, UA: NEGATIVE
Ketones, UA: NEGATIVE
Leukocytes,UA: NEGATIVE
Nitrite, UA: NEGATIVE
Protein,UA: NEGATIVE
Specific Gravity, UA: 1.005 (ref 1.005–1.030)
Urobilinogen, Ur: 0.2 mg/dL (ref 0.2–1.0)
pH, UA: 5.5 (ref 5.0–7.5)

## 2022-05-28 LAB — MICROSCOPIC EXAMINATION
Bacteria, UA: NONE SEEN
Epithelial Cells (non renal): NONE SEEN /hpf (ref 0–10)
WBC, UA: NONE SEEN /hpf (ref 0–5)

## 2022-06-03 ENCOUNTER — Ambulatory Visit: Payer: Medicaid Other | Admitting: Urology

## 2022-07-28 NOTE — Progress Notes (Signed)
History of Present Illness: Here for followup of vasectomy 3 mos ago.  Since his last visit he has had no real issues.  He comes in with semen specimen for semen analysis.  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   Deviated nasal septum 01/2013    Past Surgical History:  Procedure Laterality Date   APPENDECTOMY     LAPAROSCOPIC APPENDECTOMY N/A 02/26/2015   Procedure: APPENDECTOMY LAPAROSCOPIC;  Surgeon: Franky Macho, MD;  Location: [facility];  Service: General;  Laterality: N/A;   NASAL FRACTURE SURGERY  2007   NASAL RECONSTRUCTION N/A 02/07/2013   Procedure: TOTAL NASAL RECONSTRUCTION ;  Surgeon: Louisa Second, MD;  Location: [facility];  Service: Plastics;  Laterality: N/A;   NASAL SEPTUM SURGERY N/A 02/07/2013   Procedure: WITH SEPTOPLASTY;  Surgeon: Louisa Second, MD;  Location: [facility];  Service: Plastics;  Laterality: N/A;    Home Medications:  Allergies as of 07/29/2022       Reactions   Penicillins Other (See Comments)   UNKNOWN - WAS AS A CHILD        Medication List        Accurate as of July 28, 2022  7:15 PM. If you have any questions, ask your nurse or doctor.          sulfamethoxazole-trimethoprim 800-160 MG tablet Commonly known as: BACTRIM DS Take 1 tablet by mouth 2 (two) times daily.        Allergies:  Allergies  Allergen Reactions   Penicillins Other (See Comments)    UNKNOWN - WAS AS A CHILD    No family history on file.  Social History:  reports that he has never smoked. He has never used smokeless tobacco. He reports current alcohol use. He reports that he does not use drugs.  ROS: A complete review of systems was performed.  All systems are negative except for pertinent findings as noted.  Physical Exam:  Vital signs in last 24 hours: There were no vitals taken for this visit. Constitutional:  Alert and oriented, No acute distress Cardiovascular: Regular rate   Respiratory: Normal respiratory effort  Psychiatric: Normal mood and affect  I have reviewed prior pt notes  I performed semen analysis-approximately 20 high-power fields were reviewed.  I did not see any sperm.     Impression/Assessment:  Status post vasectomy, over 3 months out, doing well with negative semen analysis  Plan:  Patient is cleared to have unprotected intercourse.  He asked if it is okay to bring another specimen in a year-he will call us if he wants to repeat the test at that point.  Otherwise, return as needed

## 2022-07-29 ENCOUNTER — Ambulatory Visit (INDEPENDENT_AMBULATORY_CARE_PROVIDER_SITE_OTHER): Payer: Medicaid Other | Admitting: Urology

## 2022-07-29 ENCOUNTER — Encounter: Payer: Self-pay | Admitting: Urology

## 2022-07-29 VITALS — BP 140/86 | HR 70

## 2022-07-29 DIAGNOSIS — Z9852 Vasectomy status: Secondary | ICD-10-CM | POA: Diagnosis not present

## 2022-09-14 DIAGNOSIS — H5213 Myopia, bilateral: Secondary | ICD-10-CM | POA: Diagnosis not present

## 2022-11-18 ENCOUNTER — Ambulatory Visit
Admission: EM | Admit: 2022-11-18 | Discharge: 2022-11-18 | Disposition: A | Discharge status: Home / Self Care | Payer: Self-pay | Attending: Family Medicine | Admitting: Family Medicine

## 2022-11-18 ENCOUNTER — Other Ambulatory Visit: Payer: Self-pay

## 2022-11-18 ENCOUNTER — Encounter: Payer: Self-pay | Admitting: Emergency Medicine

## 2022-11-18 DIAGNOSIS — J0300 Acute streptococcal tonsillitis, unspecified: Secondary | ICD-10-CM

## 2022-11-18 LAB — POCT RAPID STREP A (OFFICE): Rapid Strep A Screen: POSITIVE — AB

## 2022-11-18 MED ORDER — LIDOCAINE VISCOUS HCL 2 % MT SOLN: 10.0000 mL | OROMUCOSAL | 0 refills | Status: AC | PRN

## 2022-11-18 MED ORDER — AZITHROMYCIN 250 MG PO TABS: ORAL_TABLET | ORAL | 0 refills | Status: AC

## 2022-11-18 NOTE — ED Triage Notes (Signed)
Pt reports fatigue, body aches, throat "itching" since yesterday. Pt significant other tested positive for strep last week.

## 2022-11-18 NOTE — ED Provider Notes (Signed)
[facility]    CSN: [id number] Arrival date & time: 11/18/22  [id number]      History   Chief Complaint Chief Complaint  Patient presents with   Sore Throat    HPI Daniel Nunez is a 37 y.o. male.   Patient presenting today with 1 day history of fatigue, body aches, scratchy throat.  Denies fever, chills, cough, congestion, chest pain, shortness of breath.  States significant other tested positive for strep last week.  To some ibuprofen yesterday, otherwise not show anything for symptoms.    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   Deviated nasal septum 01/2013    Patient Active Problem List   Diagnosis Date Noted   Appendicitis, acute 02/26/2015   Appendicitis 02/26/2015    Past Surgical History:  Procedure Laterality Date   APPENDECTOMY     LAPAROSCOPIC APPENDECTOMY N/A 02/26/2015   Procedure: APPENDECTOMY LAPAROSCOPIC;  Surgeon: Aviva Signs, MD;  Location: [facility];  Service: General;  Laterality: N/A;   NASAL FRACTURE SURGERY  2007   NASAL RECONSTRUCTION N/A 02/07/2013   Procedure: TOTAL NASAL RECONSTRUCTION ;  Surgeon: Cristine Polio, MD;  Location: [place];  Service: Plastics;  Laterality: N/A;   NASAL SEPTUM SURGERY N/A 02/07/2013   Procedure: WITH SEPTOPLASTY;  Surgeon: Cristine Polio, MD;  Location: [place];  Service: Plastics;  Laterality: N/A;       Home Medications    Prior to Admission medications   Medication Sig Start Date End Date Taking? Authorizing Provider  azithromycin (ZITHROMAX) 250 MG tablet Take first 2 tablets together, then 1 every day until finished. 11/18/22  Yes Volney American, PA-C  lidocaine (XYLOCAINE) 2 % solution Use as directed 10 mLs in the mouth or throat every 3 (three) hours as needed for mouth pain. 11/18/22  Yes Volney American, PA-C  sulfamethoxazole-trimethoprim (BACTRIM DS) 800-160 MG tablet Take 1 tablet by mouth 2 (two) times  daily. Patient not taking: Reported on 07/29/2022 05/06/22   Franchot Gallo, MD    Family History History reviewed. No pertinent family history.  Social History Social History   Tobacco Use   Smoking status: Never   Smokeless tobacco: Never  Substance Use Topics   Alcohol use: Yes    Comment: occasionally   Drug use: No     Allergies   Penicillins   Review of Systems Review of Systems Per HPI  Physical Exam Triage Vital Signs ED Triage Vitals  Enc Vitals Group     BP 11/18/22 0958 129/82     Pulse Rate 11/18/22 0958 (!) 56     Resp 11/18/22 0958 20     Temp 11/18/22 0958 98.4 F (36.9 C)     Temp Source 11/18/22 0958 Oral     SpO2 11/18/22 0958 99 %     Weight --      Height --      Head Circumference --      Peak Flow --      Pain Score 11/18/22 0957 0     Pain Loc --      Pain Edu? --      Excl. in [place]? --    No data found.  Updated Vital Signs BP 129/82 (BP Location: Right Arm)   Pulse (!) 56   Temp 98.4 F (36.9 C) (Oral)   Resp 20   SpO2 99%   Visual Acuity Right Eye Distance:   Left Eye  Distance:   Bilateral Distance:    Right Eye Near:   Left Eye Near:    Bilateral Near:     Physical Exam Vitals and nursing note reviewed.  Constitutional:      Appearance: He is well-developed.  HENT:     Head: Atraumatic.     Right Ear: External ear normal.     Left Ear: External ear normal.     Nose: Nose normal.     Mouth/Throat:     Pharynx: Posterior oropharyngeal erythema present. No oropharyngeal exudate.  Eyes:     Conjunctiva/sclera: Conjunctivae normal.     Pupils: Pupils are equal, round, and reactive to light.  Cardiovascular:     Rate and Rhythm: Normal rate and regular rhythm.  Pulmonary:     Effort: Pulmonary effort is normal. No respiratory distress.     Breath sounds: No wheezing or rales.  Musculoskeletal:        General: Normal range of motion.     Cervical back: Normal range of motion and neck supple.  Lymphadenopathy:      Cervical: Cervical adenopathy present.  Skin:    General: Skin is warm and dry.  Neurological:     Mental Status: He is alert and oriented to person, place, and time.  Psychiatric:        Behavior: Behavior normal.      UC Treatments / Results  Labs (all labs ordered are listed, but only abnormal results are displayed) Labs Reviewed  POCT RAPID STREP A (OFFICE) - Abnormal; Notable for the following components:      Result Value   Rapid Strep A Screen Positive (*)    All other components within normal limits    EKG   Radiology No results found.  Procedures Procedures (including critical care time)  Medications Ordered in UC Medications - No data to display  Initial Impression / Assessment and Plan / UC Course  I have reviewed the triage vital signs and the nursing notes.  Pertinent labs & imaging results that were available during my care of the patient were reviewed by me and considered in my medical decision making (see chart for details).     Rapid strep positive, treat with azithromycin, viscous lidocaine, supportive over-the-counter medications and home care.  Work note given.  Return for worsening symptoms. Final Clinical Impressions(s) / UC Diagnoses   Final diagnoses:  Strep tonsillitis   Discharge Instructions   None    ED Prescriptions     Medication Sig Dispense Auth. Provider   azithromycin (ZITHROMAX) 250 MG tablet Take first 2 tablets together, then 1 every day until finished. 6 tablet Volney American, PA-C   lidocaine (XYLOCAINE) 2 % solution Use as directed 10 mLs in the mouth or throat every 3 (three) hours as needed for mouth pain. 100 mL Volney American, [place]      PDMP not reviewed this encounter.   Volney American, [place] 11/18/22 1102

## 2023-04-29 ENCOUNTER — Encounter: Payer: Self-pay | Admitting: Emergency Medicine

## 2023-04-29 ENCOUNTER — Ambulatory Visit
Admission: EM | Admit: 2023-04-29 | Discharge: 2023-04-29 | Disposition: A | Discharge status: Home / Self Care | Payer: Self-pay | Attending: Family Medicine | Admitting: Family Medicine

## 2023-04-29 ENCOUNTER — Other Ambulatory Visit: Payer: Self-pay

## 2023-04-29 DIAGNOSIS — Z1152 Encounter for screening for COVID-19: Secondary | ICD-10-CM | POA: Insufficient documentation

## 2023-04-29 DIAGNOSIS — J069 Acute upper respiratory infection, unspecified: Secondary | ICD-10-CM

## 2023-04-29 MED ORDER — PROMETHAZINE-DM 6.25-15 MG/5ML PO SYRP: 5.0000 mL | ORAL_SOLUTION | Freq: Four times a day (QID) | ORAL | 0 refills | Status: AC | PRN

## 2023-04-29 MED ORDER — ALBUTEROL SULFATE HFA 108 (90 BASE) MCG/ACT IN AERS: 2.0000 | INHALATION_SPRAY | RESPIRATORY_TRACT | 0 refills | Status: AC | PRN

## 2023-04-29 NOTE — ED Provider Notes (Signed)
[facility]    CSN: [id number] Arrival date & time: 04/29/23  0903      History   Chief Complaint Chief Complaint  Patient presents with   Cough    HPI Daniel Nunez is a 37 y.o. male.   Patient presenting today with 5-day history of nasal congestion, productive cough, hoarseness, scratchy throat.  Denies fever, chills, chest pain, shortness of breath, abdominal pain, nausea vomiting or diarrhea.  No known history of chronic pulmonary disease.  So far trying some DayQuil and NyQuil with minimal relief.  No known sick contacts recently.    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   Deviated nasal septum 01/2013    Patient Active Problem List   Diagnosis Date Noted   Appendicitis, acute 02/26/2015   Appendicitis 02/26/2015    Past Surgical History:  Procedure Laterality Date   APPENDECTOMY     LAPAROSCOPIC APPENDECTOMY N/A 02/26/2015   Procedure: APPENDECTOMY LAPAROSCOPIC;  Surgeon: Franky Macho, MD;  Location: [facility];  Service: General;  Laterality: N/A;   NASAL FRACTURE SURGERY  2007   NASAL RECONSTRUCTION N/A 02/07/2013   Procedure: TOTAL NASAL RECONSTRUCTION ;  Surgeon: Louisa Second, MD;  Location: [facility];  Service: Plastics;  Laterality: N/A;   NASAL SEPTUM SURGERY N/A 02/07/2013   Procedure: WITH SEPTOPLASTY;  Surgeon: Louisa Second, MD;  Location: [facility];  Service: Plastics;  Laterality: N/A;       Home Medications    Prior to Admission medications   Medication Sig Start Date End Date Taking? Authorizing Provider  albuterol (VENTOLIN HFA) 108 (90 Base) MCG/ACT inhaler Inhale 2 puffs into the lungs every 4 (four) hours as needed for wheezing or shortness of breath. 04/29/23  Yes Particia Nearing, PA-C  promethazine-dextromethorphan (PROMETHAZINE-DM) 6.25-15 MG/5ML syrup Take 5 mLs by mouth 4 (four) times daily as needed. 04/29/23  Yes Particia Nearing, PA-C  azithromycin  (ZITHROMAX) 250 MG tablet Take first 2 tablets together, then 1 every day until finished. 11/18/22   Particia Nearing, PA-C  lidocaine (XYLOCAINE) 2 % solution Use as directed 10 mLs in the mouth or throat every 3 (three) hours as needed for mouth pain. Patient not taking: Reported on 04/29/2023 11/18/22   Particia Nearing, PA-C  sulfamethoxazole-trimethoprim (BACTRIM DS) 800-160 MG tablet Take 1 tablet by mouth 2 (two) times daily. Patient not taking: Reported on 07/29/2022 05/06/22   Marcine Matar, MD    Family History History reviewed. No pertinent family history.  Social History Social History   Tobacco Use   Smoking status: Never   Smokeless tobacco: Never  Substance Use Topics   Alcohol use: Yes    Comment: occasionally   Drug use: No     Allergies   Penicillins   Review of Systems Review of Systems PER HPI  Physical Exam Triage Vital Signs ED Triage Vitals [04/29/23 0917]  Encounter Vitals Group     BP 131/85     Systolic BP Percentile      Diastolic BP Percentile      Pulse Rate 73     Resp 20     Temp 98.4 F (36.9 C)     Temp Source Oral     SpO2 95 %     Weight      Height      Head Circumference      Peak Flow      Pain Score 0  Pain Loc      Pain Education      Exclude from Growth Chart    No data found.  Updated Vital Signs BP 131/85 (BP Location: Right Arm)   Pulse 73   Temp 98.4 F (36.9 C) (Oral)   Resp 20   SpO2 95%   Visual Acuity Right Eye Distance:   Left Eye Distance:   Bilateral Distance:    Right Eye Near:   Left Eye Near:    Bilateral Near:     Physical Exam Vitals and nursing note reviewed.  Constitutional:      Appearance: He is well-developed.  HENT:     Head: Atraumatic.     Right Ear: External ear normal.     Left Ear: External ear normal.     Nose: Rhinorrhea present.     Mouth/Throat:     Pharynx: Posterior oropharyngeal erythema present. No oropharyngeal exudate.  Eyes:      Conjunctiva/sclera: Conjunctivae normal.     Pupils: Pupils are equal, round, and reactive to light.  Cardiovascular:     Rate and Rhythm: Normal rate and regular rhythm.  Pulmonary:     Effort: Pulmonary effort is normal. No respiratory distress.     Breath sounds: No wheezing or rales.  Musculoskeletal:        General: Normal range of motion.     Cervical back: Normal range of motion and neck supple.  Lymphadenopathy:     Cervical: No cervical adenopathy.  Skin:    General: Skin is warm and dry.  Neurological:     Mental Status: He is alert and oriented to person, place, and time.  Psychiatric:        Behavior: Behavior normal.      UC Treatments / Results  Labs (all labs ordered are listed, but only abnormal results are displayed) Labs Reviewed  SARS CORONAVIRUS 2 (TAT 6-24 HRS)    EKG   Radiology No results found.  Procedures Procedures (including critical care time)  Medications Ordered in UC Medications - No data to display  Initial Impression / Assessment and Plan / UC Course  I have reviewed the triage vital signs and the nursing notes.  Pertinent labs & imaging results that were available during my care of the patient were reviewed by me and considered in my medical decision making (see chart for details).     Vital signs and exam overall reassuring and suggestive of a viral respiratory infection.  COVID testing pending, good candidate for Paxlovid if positive.  Treat symptomatically with Phenergan DM, albuterol, supportive over-the-counter medications and home care.  Return for worsening symptoms. Final Clinical Impressions(s) / UC Diagnoses   Final diagnoses:  Viral URI with cough     Discharge Instructions      Your COVID test should be back tomorrow and someone will call if positive.  In the meantime, take the medications prescribed as well as DayQuil, NyQuil, Flonase, Mucinex and drink plenty of fluids.    ED Prescriptions     Medication  Sig Dispense Auth. Provider   albuterol (VENTOLIN HFA) 108 (90 Base) MCG/ACT inhaler Inhale 2 puffs into the lungs every 4 (four) hours as needed for wheezing or shortness of breath. 18 g Roosvelt Maser [place], [place]   promethazine-dextromethorphan (PROMETHAZINE-DM) 6.25-15 MG/5ML syrup Take 5 mLs by mouth 4 (four) times daily as needed. 100 mL Particia Nearing, [place]      PDMP not reviewed this encounter.   Particia Nearing,  PA-C 04/29/23 1037

## 2023-04-29 NOTE — ED Triage Notes (Signed)
Pt reports nasal congestion, intermittent productive cough since last Friday. Reports voice has become hoarse since last night. NAD noted.

## 2023-04-29 NOTE — Discharge Instructions (Signed)
Your COVID test should be back tomorrow and someone will call if positive.  In the meantime, take the medications prescribed as well as DayQuil, NyQuil, Flonase, Mucinex and drink plenty of fluids.

## 2023-04-30 LAB — SARS CORONAVIRUS 2 (TAT 6-24 HRS): SARS Coronavirus 2: NEGATIVE
